# Patient Record
Sex: Male | Born: 1940 | Race: White | Hispanic: No | Marital: Married | Smoking: Never smoker
Health system: Southern US, Community
[De-identification: ages and names within clinical notes are randomized; demographics above are authoritative.]

## PROBLEM LIST (undated history)

## (undated) DIAGNOSIS — C801 Malignant (primary) neoplasm, unspecified: Secondary | ICD-10-CM

## (undated) DIAGNOSIS — I10 Essential (primary) hypertension: Secondary | ICD-10-CM

## (undated) DIAGNOSIS — G3184 Mild cognitive impairment, so stated: Secondary | ICD-10-CM

## (undated) DIAGNOSIS — N401 Enlarged prostate with lower urinary tract symptoms: Secondary | ICD-10-CM

## (undated) DIAGNOSIS — R413 Other amnesia: Secondary | ICD-10-CM

## (undated) DIAGNOSIS — C439 Malignant melanoma of skin, unspecified: Secondary | ICD-10-CM

## (undated) DIAGNOSIS — R339 Retention of urine, unspecified: Secondary | ICD-10-CM

## (undated) DIAGNOSIS — N3943 Post-void dribbling: Secondary | ICD-10-CM

## (undated) DIAGNOSIS — E785 Hyperlipidemia, unspecified: Secondary | ICD-10-CM

## (undated) DIAGNOSIS — E041 Nontoxic single thyroid nodule: Secondary | ICD-10-CM

## (undated) HISTORY — DX: Hyperlipidemia, unspecified: E78.5

## (undated) HISTORY — DX: Malignant melanoma of skin, unspecified: C43.9

## (undated) HISTORY — DX: Retention of urine, unspecified: R33.9

## (undated) HISTORY — DX: Other amnesia: R41.3

## (undated) HISTORY — DX: Essential (primary) hypertension: I10

## (undated) HISTORY — PX: HYDROCELE EXCISION: SHX482

## (undated) HISTORY — DX: Mild cognitive impairment, so stated: G31.84

## (undated) HISTORY — DX: Malignant (primary) neoplasm, unspecified: C80.1

## (undated) HISTORY — DX: Post-void dribbling: N39.43

## (undated) HISTORY — DX: Benign prostatic hyperplasia with lower urinary tract symptoms: N40.1

## (undated) HISTORY — DX: Nontoxic single thyroid nodule: E04.1

---

## 1975-11-20 HISTORY — PX: VASECTOMY: SHX75

## 2005-06-21 ENCOUNTER — Ambulatory Visit: Payer: Self-pay | Admitting: Internal Medicine

## 2008-10-26 ENCOUNTER — Ambulatory Visit: Payer: Self-pay | Admitting: Unknown Physician Specialty

## 2011-08-03 ENCOUNTER — Ambulatory Visit: Payer: Self-pay | Admitting: Internal Medicine

## 2011-08-28 DIAGNOSIS — G3184 Mild cognitive impairment, so stated: Secondary | ICD-10-CM

## 2011-08-28 HISTORY — DX: Mild cognitive impairment of uncertain or unknown etiology: G31.84

## 2013-05-27 DIAGNOSIS — N3943 Post-void dribbling: Secondary | ICD-10-CM

## 2013-05-27 DIAGNOSIS — N401 Enlarged prostate with lower urinary tract symptoms: Secondary | ICD-10-CM

## 2013-05-27 DIAGNOSIS — R339 Retention of urine, unspecified: Secondary | ICD-10-CM

## 2013-05-27 HISTORY — DX: Post-void dribbling: N39.43

## 2013-05-27 HISTORY — DX: Benign prostatic hyperplasia with lower urinary tract symptoms: N40.1

## 2013-05-27 HISTORY — DX: Retention of urine, unspecified: R33.9

## 2014-01-11 ENCOUNTER — Ambulatory Visit: Payer: Self-pay | Admitting: Unknown Physician Specialty

## 2014-05-16 DIAGNOSIS — E785 Hyperlipidemia, unspecified: Secondary | ICD-10-CM | POA: Insufficient documentation

## 2014-05-16 DIAGNOSIS — N138 Other obstructive and reflux uropathy: Secondary | ICD-10-CM | POA: Insufficient documentation

## 2014-05-16 DIAGNOSIS — I10 Essential (primary) hypertension: Secondary | ICD-10-CM | POA: Insufficient documentation

## 2014-05-16 DIAGNOSIS — E041 Nontoxic single thyroid nodule: Secondary | ICD-10-CM

## 2014-05-16 DIAGNOSIS — N401 Enlarged prostate with lower urinary tract symptoms: Secondary | ICD-10-CM | POA: Insufficient documentation

## 2014-05-16 HISTORY — DX: Hyperlipidemia, unspecified: E78.5

## 2014-05-16 HISTORY — DX: Nontoxic single thyroid nodule: E04.1

## 2014-05-16 HISTORY — DX: Essential (primary) hypertension: I10

## 2015-07-22 DIAGNOSIS — Z0000 Encounter for general adult medical examination without abnormal findings: Secondary | ICD-10-CM | POA: Insufficient documentation

## 2015-07-22 DIAGNOSIS — R413 Other amnesia: Secondary | ICD-10-CM | POA: Insufficient documentation

## 2015-07-22 HISTORY — DX: Other amnesia: R41.3

## 2017-10-02 ENCOUNTER — Encounter: Payer: Self-pay | Admitting: Urology

## 2017-10-02 ENCOUNTER — Ambulatory Visit: Payer: Medicare Other | Admitting: Urology

## 2017-10-02 VITALS — BP 134/67 | HR 83 | Ht 71.0 in | Wt 140.0 lb

## 2017-10-02 DIAGNOSIS — R350 Frequency of micturition: Secondary | ICD-10-CM

## 2017-10-02 DIAGNOSIS — N401 Enlarged prostate with lower urinary tract symptoms: Secondary | ICD-10-CM

## 2017-10-02 LAB — BLADDER SCAN AMB NON-IMAGING

## 2017-10-02 MED ORDER — TAMSULOSIN HCL 0.4 MG PO CAPS: 0.4000 mg | ORAL_CAPSULE | Freq: Every day | ORAL | 3 refills | Status: DC

## 2017-10-02 NOTE — Progress Notes (Signed)
10/02/2017 2:10 PM   NANCY MANUELE 18-Nov-1941 [id number]  Referring provider: No referring provider defined for this encounter.  Chief Complaint  Patient presents with  . Urinary Frequency    HPI: Juan Barron is a 76 year old male who presents for transfer of care from [facility]  He has seen Dr. Jacqlyn Larsen for several years for BPH with lower urinary tract symptoms and is on tamsulosin.  He was last seen at [facility] in August 2017.  He has stable lower urinary tract symptoms and has occasional urinary frequency and nocturia x1.  IPSS completed today was 2/35 with a q. OL rated 1/6.  He denies dysuria or gross hematuria.  Denies flank, abdominal, pelvic or scrotal pain.  He has occasional postvoid dribbling.   PMH: Past Medical History:  Diagnosis Date  . 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 HTN (hypertension), benign 05/16/2014   Overview:  years of "white coat HTN", started drug treatment in 07/2011 Last Assessment & Plan:  Taking medications without noted side effects or dizziness.    . Hyperlipidemia, unspecified 05/16/2014   Last Assessment & Plan:  Diet for healthy cholesterol is being attempted and no clear myalgia's or other side effects are noted.    . Incomplete emptying of bladder 05/27/2013  . Memory disturbance 07/22/2015   Overview:  Mild and seemingly not progressive  Last Assessment & Plan:  Memory is poor but not clearly progressive  . 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  MMSE 27/30.  MOCA on 08/25/13 was 20/30  . Post-void dribbling 05/27/2013  . Thyroid nodule 05/16/2014   Overview:  And subclinical hypothyroid  Last Assessment & Plan:  Tsh and energy are fairly stable.     Surgical History: Past Surgical History:  Procedure Laterality Date  . HYDROCELE EXCISION    . VASECTOMY  1977    Home Medications:  Allergies as of 10/02/2017   No Known Allergies       Medication List        Accurate as of 10/02/17  2:10 PM. Always use your most recent med list.          amLODipine 5 MG tablet Commonly known as:  NORVASC   aspirin EC 81 MG tablet Take by mouth.   DOCOSAHEXAENOIC ACID PO Take by mouth.   multivitamin with minerals tablet Take by mouth.   tamsulosin 0.4 MG Caps capsule Commonly known as:  FLOMAX Take 1 capsule (0.4 mg total) daily after breakfast by mouth.       Allergies: No Known Allergies  Family History: Family History  Problem Relation Age of Onset  . Kidney cancer Sister   . Prostate cancer Neg Hx   . Bladder Cancer Neg Hx     Social History:  reports that  has never smoked. he has never used smokeless tobacco. He reports that he does not drink alcohol or use drugs.  ROS: UROLOGY Frequent Urination?: Yes Hard to postpone urination?: Yes Burning/pain with urination?: No Get up at night to urinate?: Yes Leakage of urine?: Yes Urine stream starts and stops?: No Trouble starting stream?: No Do you have to strain to urinate?: No Blood in urine?: No Urinary tract infection?: No Sexually transmitted disease?: No Injury to kidneys or bladder?: No Painful intercourse?: No Weak stream?: No Erection problems?: No Penile pain?: No  Gastrointestinal Nausea?: No Vomiting?: No Indigestion/heartburn?:  No Diarrhea?: No Constipation?: No  Constitutional Fever: No Night sweats?: No Weight loss?: No Fatigue?: No  Skin Skin rash/lesions?: No Itching?: No  Eyes Blurred vision?: No Double vision?: No  Ears/Nose/Throat Sore throat?: No Sinus problems?: No  Hematologic/Lymphatic Swollen glands?: No Easy bruising?: No  Cardiovascular Leg swelling?: No Chest pain?: No  Respiratory Cough?: No Shortness of breath?: No  Endocrine Excessive thirst?: No  Musculoskeletal Back pain?: No Joint pain?: No  Neurological Headaches?: No Dizziness?: No  Psychologic Depression?: No Anxiety?:  No  Physical Exam: BP 134/67   Pulse 83   Ht 5\' 11"  (1.803 m)   Wt 140 lb (63.5 kg)   BMI 19.53 kg/m   Constitutional:  Alert and oriented, No acute distress. HEENT: [place] AT, moist mucus membranes.  Trachea midline, no masses. Cardiovascular: No clubbing, cyanosis, or edema. Respiratory: Normal respiratory effort, no increased work of breathing. GI: Abdomen is soft, nontender, nondistended, no abdominal masses GU: No CVA tenderness.  Prostate flat, smooth, 35 g Skin: No rashes, bruises or suspicious lesions. Lymph: No cervical or inguinal adenopathy. Neurologic: Grossly intact, no focal deficits, moving all 4 extremities. Psychiatric: Normal mood and affect.  Laboratory Data:  Urinalysis    Assessment & Plan:    1.  BPH with lower urinary tract symptoms Stable voiding symptoms on tamsulosin which was refilled.  PVR by bladder scan was 70 mL.  Follow-up annually or as needed for any significant change in his symptoms.  - Urinalysis, Complete - BLADDER SCAN AMB NON-IMAGING   Return in about 1 year (around 10/02/2018) for Recheck.  Abbie Sons, [place] [address], [place], [place] [address] [phone]

## 2017-10-03 LAB — URINALYSIS, COMPLETE
Bilirubin, UA: NEGATIVE
Glucose, UA: NEGATIVE
Ketones, UA: NEGATIVE
Leukocytes, UA: NEGATIVE
NITRITE UA: NEGATIVE
Protein, UA: NEGATIVE
RBC, UA: NEGATIVE
Specific Gravity, UA: 1.02 (ref 1.005–1.030)
Urobilinogen, Ur: 0.2 mg/dL (ref 0.2–1.0)
pH, UA: 7 (ref 5.0–7.5)

## 2017-10-03 LAB — MICROSCOPIC EXAMINATION
EPITHELIAL CELLS (NON RENAL): NONE SEEN /HPF (ref 0–10)
RBC MICROSCOPIC, UA: NONE SEEN /HPF (ref 0–?)
WBC, UA: NONE SEEN /hpf (ref 0–?)

## 2018-10-01 ENCOUNTER — Ambulatory Visit (INDEPENDENT_AMBULATORY_CARE_PROVIDER_SITE_OTHER): Payer: Medicare Other | Admitting: Urology

## 2018-10-01 ENCOUNTER — Encounter: Payer: Self-pay | Admitting: Urology

## 2018-10-01 VITALS — BP 133/61 | HR 66 | Ht 71.0 in | Wt 138.5 lb

## 2018-10-01 DIAGNOSIS — R350 Frequency of micturition: Secondary | ICD-10-CM

## 2018-10-01 DIAGNOSIS — N401 Enlarged prostate with lower urinary tract symptoms: Secondary | ICD-10-CM | POA: Diagnosis not present

## 2018-10-01 LAB — BLADDER SCAN AMB NON-IMAGING

## 2018-10-01 MED ORDER — TAMSULOSIN HCL 0.4 MG PO CAPS: 0.4000 mg | ORAL_CAPSULE | Freq: Every day | ORAL | 3 refills | Status: DC

## 2018-10-01 NOTE — Progress Notes (Signed)
10/01/2018 9:32 AM   Juan Barron 07-28-1941 [id number]  Referring provider: Kirk Ruths, MD [facility] [place], [place] [address]  Chief Complaint  Patient presents with  . Benign Prostatic Hypertrophy   Urologic history: 1.  BPH with lower urinary tract symptoms -On tamsulosin  HPI: 77 year old male presents for annual follow-up.  He remains on tamsulosin and has no bothersome lower urinary tract symptoms.  He gets up once per night to void.  IPSS completed today was 1/35 with a QOL rated 1/6.  He denies dysuria or gross hematuria.  Denies flank/abdominal/pelvic or scrotal pain.  He had a PSA with his PCP in September 2019 which was 0.39.   PMH: Past Medical History:  Diagnosis Date  . 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 HTN (hypertension), benign 05/16/2014   Overview:  years of "white coat HTN", started drug treatment in 07/2011 Last Assessment & Plan:  Taking medications without noted side effects or dizziness.    . Hyperlipidemia, unspecified 05/16/2014   Last Assessment & Plan:  Diet for healthy cholesterol is being attempted and no clear myalgia's or other side effects are noted.    . Incomplete emptying of bladder 05/27/2013  . Memory disturbance 07/22/2015   Overview:  Mild and seemingly not progressive  Last Assessment & Plan:  Memory is poor but not clearly progressive  . 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  MMSE 27/30.  MOCA on 08/25/13 was 20/30  . Post-void dribbling 05/27/2013  . Thyroid nodule 05/16/2014   Overview:  And subclinical hypothyroid  Last Assessment & Plan:  Tsh and energy are fairly stable.     Surgical History: Past Surgical History:  Procedure Laterality Date  . HYDROCELE EXCISION    . VASECTOMY  1977    Home Medications:  Allergies as of 10/01/2018   No Known Allergies       Medication List        Accurate as of 10/01/18  9:32 AM. Always use your most recent med list.          amLODipine 5 MG tablet Commonly known as:  NORVASC   aspirin EC 81 MG tablet Take by mouth.   multivitamin with minerals tablet Take by mouth.   tamsulosin 0.4 MG Caps capsule Commonly known as:  FLOMAX Take 1 capsule (0.4 mg total) daily after breakfast by mouth.       Allergies: No Known Allergies  Family History: Family History  Problem Relation Age of Onset  . Kidney cancer Sister   . Prostate cancer Neg Hx   . Bladder Cancer Neg Hx     Social History:  reports that he has never smoked. He has never used smokeless tobacco. He reports that he does not drink alcohol or use drugs.  ROS: Please refer to today's intake sheet for a complete review of systems  Physical Exam: BP 133/61 (BP Location: Left Arm, Patient Position: Sitting, Cuff Size: Normal)   Pulse 66   Ht 5\' 11"  (1.803 m)   Wt 138 lb 8 oz (62.8 kg)   BMI 19.32 kg/m   Constitutional:  Alert and oriented, No acute distress. HEENT: [place] AT, moist mucus membranes.  Trachea midline, no masses. Cardiovascular: No clubbing, cyanosis, or edema. Respiratory: Normal respiratory effort, no increased work of breathing. Lymph: No cervical or  inguinal lymphadenopathy. Skin: No rashes, bruises or suspicious lesions. Neurologic: Grossly intact, no focal deficits, moving all 4 extremities. Psychiatric: Normal mood and affect.   Assessment & Plan:   77 year old male with stable lower urinary tract symptoms on tamsulosin.  PVR by bladder scan today was 0 mL.  Tamsulosin was refilled.  Continue annual follow-up.  - Bladder Scan (Post Void Residual) in office    Abbie Sons, MD  [place] [address], [place], [place] [address] [phone]

## 2018-12-26 DIAGNOSIS — Z86010 Personal history of colonic polyps: Secondary | ICD-10-CM | POA: Insufficient documentation

## 2019-01-11 ENCOUNTER — Emergency Department
Admission: EM | Admit: 2019-01-11 | Discharge: 2019-01-11 | Disposition: A | Discharge status: Home / Self Care | Payer: Medicare Other | Attending: Emergency Medicine | Admitting: Emergency Medicine

## 2019-01-11 ENCOUNTER — Other Ambulatory Visit: Payer: Self-pay

## 2019-01-11 DIAGNOSIS — I10 Essential (primary) hypertension: Secondary | ICD-10-CM | POA: Diagnosis not present

## 2019-01-11 DIAGNOSIS — R55 Syncope and collapse: Secondary | ICD-10-CM | POA: Insufficient documentation

## 2019-01-11 DIAGNOSIS — E785 Hyperlipidemia, unspecified: Secondary | ICD-10-CM | POA: Diagnosis not present

## 2019-01-11 DIAGNOSIS — Z79899 Other long term (current) drug therapy: Secondary | ICD-10-CM | POA: Diagnosis not present

## 2019-01-11 LAB — COMPREHENSIVE METABOLIC PANEL
ALBUMIN: 3.7 g/dL (ref 3.5–5.0)
ALK PHOS: 62 U/L (ref 38–126)
ALT: 13 U/L (ref 0–44)
AST: 17 U/L (ref 15–41)
Anion gap: 3 — ABNORMAL LOW (ref 5–15)
BILIRUBIN TOTAL: 0.7 mg/dL (ref 0.3–1.2)
BUN: 22 mg/dL (ref 8–23)
CALCIUM: 7.9 mg/dL — AB (ref 8.9–10.3)
CO2: 29 mmol/L (ref 22–32)
CREATININE: 1.07 mg/dL (ref 0.61–1.24)
Chloride: 107 mmol/L (ref 98–111)
GFR calc Af Amer: >60 mL/min (ref >60)
GLUCOSE: 108 mg/dL — AB (ref 70–99)
Potassium: 3.5 mmol/L (ref 3.5–5.1)
SODIUM: 139 mmol/L (ref 135–145)
TOTAL PROTEIN: 6.1 g/dL — AB (ref 6.5–8.1)

## 2019-01-11 LAB — CBC
HCT: 39.1 % (ref 39.0–52.0)
Hemoglobin: 13.2 g/dL (ref 13.0–17.0)
MCH: 31.4 pg (ref 26.0–34.0)
MCHC: 33.8 g/dL (ref 30.0–36.0)
MCV: 92.9 fL (ref 80.0–100.0)
NRBC: 0 % (ref 0.0–0.2)
PLATELETS: 197 10*3/uL (ref 150–400)
RBC: 4.21 MIL/uL — AB (ref 4.22–5.81)
RDW: 12.6 % (ref 11.5–15.5)
WBC: 6.7 10*3/uL (ref 4.0–10.5)

## 2019-01-11 LAB — CBC WITH DIFFERENTIAL/PLATELET
Abs Immature Granulocytes: 0.06 10*3/uL (ref 0.00–0.07)
BASOS PCT: 1 %
Basophils Absolute: 0.1 10*3/uL (ref 0.0–0.1)
Eosinophils Absolute: 0.1 10*3/uL (ref 0.0–0.5)
Eosinophils Relative: 1 %
HCT: 40.8 % (ref 39.0–52.0)
Hemoglobin: 13.7 g/dL (ref 13.0–17.0)
Immature Granulocytes: 1 %
LYMPHS ABS: 1.1 10*3/uL (ref 0.7–4.0)
Lymphocytes Relative: 11 %
MCH: 30.9 pg (ref 26.0–34.0)
MCHC: 33.6 g/dL (ref 30.0–36.0)
MCV: 91.9 fL (ref 80.0–100.0)
MONOS PCT: 4 %
Monocytes Absolute: 0.4 10*3/uL (ref 0.1–1.0)
Neutro Abs: 8.4 10*3/uL — ABNORMAL HIGH (ref 1.7–7.7)
Neutrophils Relative %: 82 %
Platelets: 212 10*3/uL (ref 150–400)
RBC: 4.44 MIL/uL (ref 4.22–5.81)
RDW: 12.5 % (ref 11.5–15.5)
WBC: 10.1 10*3/uL (ref 4.0–10.5)
nRBC: 0 % (ref 0.0–0.2)

## 2019-01-11 LAB — TROPONIN I

## 2019-01-11 MED ORDER — SODIUM CHLORIDE 0.9 % IV BOLUS
1000.0000 mL | Freq: Once | INTRAVENOUS | Status: AC
  Administered 2019-01-11: 1000 mL via INTRAVENOUS

## 2019-01-11 NOTE — ED Notes (Signed)
Pt up to use urinal at bedside at this time

## 2019-01-11 NOTE — ED Notes (Signed)
zole pads placed on pt to monitor at this time

## 2019-01-11 NOTE — ED Notes (Signed)
Report from lorrie, rn.  

## 2019-01-11 NOTE — ED Triage Notes (Addendum)
Pt arrives via ems from [employer]. Ems states that he was at [facility] in bleechers, sitting, when lost consciousness. On first paramedic arrival to scene medic could not palpate, or auscultate peripheral pulse or pressure, pt appeared grey in color at that time. Pt had a carotid pulse at that time and was conscious. Pt then experienced a second syncopal episode. Wife states pt was unconscious and not responding for several minutes. On arrival to ed pt alert, NAD noted at this time, with good skin color but wife states he has some short term memory loss at times. Functions normally but when upset increases confusion. Pt received 600 mL NS prior to arrival.

## 2019-01-11 NOTE — ED Provider Notes (Signed)
[facility] Emergency Department Provider Note  ____________________________________________  Time seen: Approximately 3:18 PM  I have reviewed the triage vital signs and the nursing notes.   HISTORY  Chief Complaint Loss of Consciousness    HPI Juan Barron is a 77 y.o. male with a history of hypertension and prostatic hypertrophy and chronic "memory issues" who was in his usual state of health  and attending a high school volleyball game today when he suddenly felt warm and lightheaded and then passed out.  He reports feeling "great" at the beginning of the volleyball game when he entered the gym.  He ate normally and has been drinking fluids including lunch at a restaurant with tea.  Denies any prodromal symptoms such as chest pain abdominal pain back pain headache vision change paresthesias weakness or shortness of breath.  No symptoms afterward.  First medic on scene noted absent peripheral pulse but intact carotid pulse and grayish skin color.  Apparently as the patient started to feel better they had the patient sit upright which caused him to pass out a second time.  EMS gave 600 mL's of normal saline prior to arrival and patient reports he currently feels totally fine and has no symptoms.  Denies any prior episodes of orthostatic symptoms such as dizziness or passing out at home.  No exertional symptoms.  Normally walks 4 miles a day.     Past Medical History:  Diagnosis Date  . 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 HTN (hypertension), benign 05/16/2014   Overview:  years of "white coat HTN", started drug treatment in 07/2011 Last Assessment & Plan:  Taking medications without noted side effects or dizziness.    . Hyperlipidemia, unspecified 05/16/2014   Last Assessment & Plan:  Diet for healthy cholesterol is being attempted and no clear myalgia's or other side effects are  noted.    . Incomplete emptying of bladder 05/27/2013  . Memory disturbance 07/22/2015   Overview:  Mild and seemingly not progressive  Last Assessment & Plan:  Memory is poor but not clearly progressive  . 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  MMSE 27/30.  MOCA on 08/25/13 was 20/30  . Post-void dribbling 05/27/2013  . Thyroid nodule 05/16/2014   Overview:  And subclinical hypothyroid  Last Assessment & Plan:  Tsh and energy are fairly stable.      Patient Active Problem List   Diagnosis Date Noted  . Health care maintenance 07/22/2015  . Memory disturbance 07/22/2015  . HTN (hypertension), benign 05/16/2014  . Hyperlipidemia, unspecified 05/16/2014  . Thyroid nodule 05/16/2014  . BPH with obstruction/lower urinary tract symptoms 05/16/2014  . Benign prostatic hyperplasia with urinary frequency 05/27/2013  . Incomplete emptying of bladder 05/27/2013  . Post-void dribbling 05/27/2013  . Mild cognitive impairment with memory loss 08/28/2011     Past Surgical History:  Procedure Laterality Date  . HYDROCELE EXCISION    . VASECTOMY  1977     Prior to Admission medications   Medication Sig Start Date End Date Taking? Authorizing Provider  amLODipine (NORVASC) 5 MG tablet  07/02/17   [provider]  aspirin EC 81 MG tablet Take by mouth.    [provider]  Multiple Vitamins-Minerals (MULTIVITAMIN WITH MINERALS) tablet Take by mouth.    [provider]  tamsulosin (FLOMAX) 0.4 MG CAPS capsule Take 1 capsule (  0.4 mg total) by mouth daily after breakfast. 10/01/18   Stoioff, Ronda Fairly, MD     Allergies Patient has no known allergies.   Family History  Problem Relation Age of Onset  . Kidney cancer Sister   . Prostate cancer Neg Hx   . Bladder Cancer Neg Hx     Social History Social History   Tobacco Use  . Smoking status: Never Smoker  . Smokeless tobacco: Never Used  Substance Use Topics  . Alcohol use:  No    Frequency: Never  . Drug use: No    Review of Systems  Constitutional:   No fever or chills.  ENT:   No sore throat. No rhinorrhea. Cardiovascular:   No chest pain or syncope. Respiratory:   No dyspnea or cough. Gastrointestinal:   Negative for abdominal pain, vomiting and diarrhea.  Musculoskeletal:   Negative for focal pain or swelling All other systems reviewed and are negative except as documented above in ROS and HPI.  ____________________________________________   PHYSICAL EXAM:  VITAL SIGNS: ED Triage Vitals  Enc Vitals Group     BP 01/11/19 1506 140/67     Pulse Rate 01/11/19 1506 61     Resp 01/11/19 1506 20     Temp 01/11/19 1506 98.2 F (36.8 C)     Temp Source 01/11/19 1506 Oral     SpO2 01/11/19 1506 98 %     Weight 01/11/19 1507 147 lb (66.7 kg)     Height 01/11/19 1507 5\' 11"  (1.803 m)     Head Circumference --      Peak Flow --      Pain Score 01/11/19 1507 0     Pain Loc --      Pain Edu? --      Excl. in [place]? --     Vital signs reviewed, nursing assessments reviewed.   Constitutional:   Alert and oriented. Non-toxic appearance. Eyes:   Conjunctivae are normal. EOMI. PERRL.  No proptosis ENT      Head:   Normocephalic and atraumatic.      Nose:   No congestion/rhinnorhea.       Mouth/Throat:   MMM, no pharyngeal erythema. No peritonsillar mass.       Neck:   No meningismus. Full ROM.  No goiter Hematological/Lymphatic/Immunilogical:   No cervical lymphadenopathy. Cardiovascular:   RRR. Symmetric bilateral radial and DP pulses.  Cap refill less than 2 seconds.  No concerning murmur Respiratory:   Normal respiratory effort without tachypnea/retractions. Breath sounds are clear and equal bilaterally. No wheezes/rales/rhonchi. Gastrointestinal:   Soft and nontender. Non distended. There is no CVA tenderness.  No rebound, rigidity, or guarding. Musculoskeletal:   Normal range of motion in all extremities. No joint effusions.  No lower extremity  tenderness.  No edema. Neurologic:   Normal speech and language.  Motor grossly intact. No acute focal neurologic deficits are appreciated.  Skin:    Skin is warm, dry and intact. No rash noted.  No petechiae, purpura, or bullae.  ____________________________________________    LABS (pertinent positives/negatives) (all labs ordered are listed, but only abnormal results are displayed) Labs Reviewed  CBC - Abnormal; Notable for the following components:      Result Value   RBC 4.21 (*)    All other components within normal limits  COMPREHENSIVE METABOLIC PANEL - Abnormal; Notable for the following components:   Glucose, Bld 108 (*)    Calcium 7.9 (*)    Total Protein 6.1 (*)  Anion gap 3 (*)    All other components within normal limits  CBC WITH DIFFERENTIAL/PLATELET - Abnormal; Notable for the following components:   Neutro Abs 8.4 (*)    All other components within normal limits  TROPONIN I  TROPONIN I   ____________________________________________   EKG  Interpreted by me Sinus rhythm rate of 60, left axis, normal intervals.  Normal QRS ST segment and T waves.  No acute ischemic changes   ____________________________________________    RADIOLOGY  No results found.  ____________________________________________   PROCEDURES Procedures  ____________________________________________  DIFFERENTIAL DIAGNOSIS   Dehydration, heat syncope, vagal episode, silent MI.  Doubt stroke, intracranial hemorrhage, cerebral aneurysm, aortic dissection, AAA, mesenteric ischemia pulmonary embolism pneumonia UTI SSTI or sepsis.  CLINICAL IMPRESSION / ASSESSMENT AND PLAN / ED COURSE  Medications ordered in the ED: Medications  sodium chloride 0.9 % bolus 1,000 mL (0 mLs Intravenous Stopped 01/11/19 1618)    Pertinent labs & imaging results that were available during my care of the patient were reviewed by me and considered in my medical decision making (see chart for  details).    Patient presents with syncope which appears to have an orthostatic component.  No prodromal symptoms, no significant symptoms afterward.  Feeling better after receiving IV fluids by EMS.  Due to age, hypertension, memory issues, I will screen for acute MI with serial troponins while checking electrolytes and giving IV fluids for hydration.  There does not appear to be any acute cardiopulmonary, vascular, or neurologic event based on the lack of symptoms and normal exam.  On telemetry monitor patient has occasional PVCs  ----------------------------------------- 8:16 PM on 01/11/2019 -----------------------------------------  Remains asymptomatic.  Second troponin and CBC stable and normal.  Vital signs remain unremarkable.  Recommend close follow-up with primary care.  Continue home medications. Will check orthostatics prior to DC.       ____________________________________________   FINAL CLINICAL IMPRESSION(S) / ED DIAGNOSES    Final diagnoses:  Syncope, unspecified syncope type     ED Discharge Orders    None      Portions of this note were generated with dragon dictation software. Dictation errors may occur despite best attempts at proofreading.   Carrie Mew, MD 01/11/19 2017

## 2019-01-11 NOTE — ED Notes (Signed)
Pt and family updated on care plan. Pt denies needs.

## 2019-01-11 NOTE — ED Notes (Signed)
Pt stood at bedside and urinated into urinal. No distress noted.

## 2019-03-04 ENCOUNTER — Encounter: Admission: RE | Payer: Self-pay | Source: Home / Self Care

## 2019-03-04 ENCOUNTER — Ambulatory Visit
Admission: RE | Admit: 2019-03-04 | Payer: Medicare Other | Source: Home / Self Care | Admitting: Unknown Physician Specialty

## 2019-03-04 SURGERY — COLONOSCOPY WITH PROPOFOL
Anesthesia: General

## 2019-10-02 ENCOUNTER — Other Ambulatory Visit: Payer: Self-pay

## 2019-10-02 ENCOUNTER — Ambulatory Visit: Payer: Medicare Other | Admitting: Urology

## 2019-10-02 ENCOUNTER — Encounter: Payer: Self-pay | Admitting: Urology

## 2019-10-02 VITALS — BP 126/63 | HR 65 | Ht 71.0 in | Wt 138.0 lb

## 2019-10-02 DIAGNOSIS — N401 Enlarged prostate with lower urinary tract symptoms: Secondary | ICD-10-CM

## 2019-10-02 DIAGNOSIS — R350 Frequency of micturition: Secondary | ICD-10-CM

## 2019-10-02 MED ORDER — TAMSULOSIN HCL 0.4 MG PO CAPS: 0.4000 mg | ORAL_CAPSULE | Freq: Every day | ORAL | 3 refills | Status: DC

## 2019-10-02 NOTE — Progress Notes (Signed)
10/02/2019 12:40 PM   Juan Barron [birth year] [id number]  Referring provider: Kirk Ruths, MD [facility] [place],  [place] [address]  Chief Complaint  Patient presents with  . Benign Prostatic Hypertrophy    Urologic history: 1.  BPH with lower urinary tract symptoms -On tamsulosin   HPI: 78 y.o. male presents for annual follow-up of BPH.  He remains on tamsulosin and states he is doing well.  He has nocturia x0-1.  Denies bothersome lower urinary tract symptoms.  Denies dysuria, gross hematuria or flank, abdominal, pelvic pain.   PMH: Past Medical History:  Diagnosis Date  . 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 HTN (hypertension), benign 05/16/2014   Overview:  years of "white coat HTN", started drug treatment in 07/2011 Last Assessment & Plan:  Taking medications without noted side effects or dizziness.    . Hyperlipidemia, unspecified 05/16/2014   Last Assessment & Plan:  Diet for healthy cholesterol is being attempted and no clear myalgia's or other side effects are noted.    . Incomplete emptying of bladder 05/27/2013  . Memory disturbance 07/22/2015   Overview:  Mild and seemingly not progressive  Last Assessment & Plan:  Memory is poor but not clearly progressive  . 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  MMSE 27/30.  MOCA on 08/25/13 was 20/30  . Post-void dribbling 05/27/2013  . Thyroid nodule 05/16/2014   Overview:  And subclinical hypothyroid  Last Assessment & Plan:  Tsh and energy are fairly stable.     Surgical History: Past Surgical History:  Procedure Laterality Date  . HYDROCELE EXCISION    . VASECTOMY  1977    Home Medications:  Allergies as of 10/02/2019   No Known Allergies     Medication List       Accurate as of October 02, 2019 12:40 PM. If you have any questions, ask your nurse or  doctor.        amLODipine 5 MG tablet Commonly known as: NORVASC   aspirin EC 81 MG tablet Take by mouth.   multivitamin with minerals tablet Take by mouth.   tamsulosin 0.4 MG Caps capsule Commonly known as: FLOMAX Take 1 capsule (0.4 mg total) by mouth daily after breakfast.       Allergies: No Known Allergies  Family History: Family History  Problem Relation Age of Onset  . Kidney cancer Sister   . Prostate cancer Neg Hx   . Bladder Cancer Neg Hx     Social History:  reports that he has never smoked. He has never used smokeless tobacco. He reports that he does not drink alcohol or use drugs.  ROS: UROLOGY Frequent Urination?: No Hard to postpone urination?: No Burning/pain with urination?: Yes Get up at night to urinate?: Yes Leakage of urine?: No Urine stream starts and stops?: No Trouble starting stream?: No Do you have to strain to urinate?: No Blood in urine?: No Urinary tract infection?: No Sexually transmitted disease?: No Injury to kidneys or bladder?: No Painful intercourse?: No Weak stream?: No Erection problems?: No Penile pain?: No  Gastrointestinal Nausea?: No Vomiting?: No Indigestion/heartburn?: No Diarrhea?: No Constipation?: No  Constitutional Fever: No Night sweats?: No Weight loss?: No Fatigue?: No  Skin Skin rash/lesions?: No Itching?: No  Eyes Blurred vision?: No Double vision?: No  Ears/Nose/Throat Sore throat?:  No Sinus problems?: No  Hematologic/Lymphatic Swollen glands?: No Easy bruising?: No  Cardiovascular Leg swelling?: No Chest pain?: No  Respiratory Cough?: No Shortness of breath?: No  Endocrine Excessive thirst?: No  Musculoskeletal Back pain?: No Joint pain?: No  Neurological Headaches?: No Dizziness?: No  Psychologic Depression?: No Anxiety?: No  Physical Exam: BP 126/63   Pulse 65   Ht 5\' 11"  (1.803 m)   Wt 138 lb (62.6 kg)   BMI 19.25 kg/m   Constitutional:  Alert and  oriented, No acute distress. HEENT: [place] AT, moist mucus membranes.  Trachea midline, no masses. Cardiovascular: No clubbing, cyanosis, or edema. Respiratory: Normal respiratory effort, no increased work of breathing. Skin: No rashes, bruises or suspicious lesions. Neurologic: Grossly intact, no focal deficits, moving all 4 extremities. Psychiatric: Normal mood and affect.   Assessment & Plan:    - BPH with lower urinary tract symptoms Stable voiding symptoms on tamsulosin which was refilled.  Return in about 1 year (around 10/01/2020) for Recheck.   Abbie Sons, [place] [address], [place], [place] [address] [phone]

## 2020-03-29 ENCOUNTER — Other Ambulatory Visit: Payer: Self-pay

## 2020-03-30 ENCOUNTER — Telehealth (INDEPENDENT_AMBULATORY_CARE_PROVIDER_SITE_OTHER): Payer: Self-pay | Admitting: Gastroenterology

## 2020-03-30 DIAGNOSIS — Z86010 Personal history of colonic polyps: Secondary | ICD-10-CM

## 2020-03-30 NOTE — Progress Notes (Signed)
Gastroenterology Pre-Procedure Review  Request Date: Tuesday 05/03/20 Requesting Physician: Dr. Allen Norris  PATIENT REVIEW QUESTIONS: The patient responded to the following health history questions as indicated:    1. Are you having any GI issues? no 2. Do you have a personal history of Polyps? yes (repeated every 5 year with Dr. Vira Agar due to history of polyps missed last year) 3. Do you have a family history of Colon Cancer or Polyps? no 4. Diabetes Mellitus? no 5. Joint replacements in the past 12 months?no 6. Major health problems in the past 3 months?no 7. Any artificial heart valves, MVP, or defibrillator?no    MEDICATIONS & ALLERGIES:    Patient reports the following regarding taking any anticoagulation/antiplatelet therapy:   Plavix, Coumadin, Eliquis, Xarelto, Lovenox, Pradaxa, Brilinta, or Effient? yes (81 mg daily) Aspirin? yes (81 mg daily)  Patient confirms/reports the following medications:  Current Outpatient Medications  Medication Sig Dispense Refill  . amLODipine (NORVASC) 5 MG tablet   3  . aspirin EC 81 MG tablet Take by mouth.    Marland Kitchen atropine 1 % ophthalmic solution Apply to eye.    . brimonidine (ALPHAGAN) 0.2 % ophthalmic solution     . CYCLOGYL 2 % ophthalmic solution Place 1 drop into the left eye 3 (three) times daily.    Marland Kitchen ketorolac (ACULAR) 0.5 % ophthalmic solution INSTILL 1 DROP IN LEFT EYE FOUR TIMES DAILY. START 72 HOURS BEFORE SURGERY    . Multiple Vitamins-Minerals (MULTIVITAMIN WITH MINERALS) tablet Take by mouth.    Marland Kitchen ofloxacin (OCUFLOX) 0.3 % ophthalmic solution INSTILL 1 DROP IN BOTH EYES FOUR TIMES DAILY. START 72 HOURS BEFORE SURGERY    . prednisoLONE acetate (PRED FORTE) 1 % ophthalmic suspension INSTILL 1 DROP INTO LEFT EYE FOUR TIMES DAILY. USE ONLY AFTER SURGERY    . tamsulosin (FLOMAX) 0.4 MG CAPS capsule Take 1 capsule (0.4 mg total) by mouth daily after breakfast. 90 capsule 3   No current facility-administered medications for this visit.     Patient confirms/reports the following allergies:  No Known Allergies  Orders Placed This Encounter  Procedures  . Procedural/ Surgical Case Request: COLONOSCOPY WITH PROPOFOL    Standing Status:   Standing    Number of Occurrences:   1    Order Specific Question:   Pre-op diagnosis    Answer:   personal history of colon polyps    Order Specific Question:   CPT Code    Answer:   CS:[id number]    AUTHORIZATION INFORMATION Primary Insurance: 1D#: Group #:  Secondary Insurance: 1D#: Group #:  SCHEDULE INFORMATION: Date:  Time: Location:

## 2020-04-14 ENCOUNTER — Ambulatory Visit: Payer: Medicare PPO | Admitting: Gastroenterology

## 2020-04-29 ENCOUNTER — Other Ambulatory Visit: Payer: Self-pay

## 2020-04-29 ENCOUNTER — Other Ambulatory Visit
Admission: RE | Admit: 2020-04-29 | Discharge: 2020-04-29 | Disposition: A | Discharge status: Home / Self Care | Payer: Medicare PPO | Source: Ambulatory Visit | Attending: Gastroenterology | Admitting: Gastroenterology

## 2020-04-29 DIAGNOSIS — Z20822 Contact with and (suspected) exposure to covid-19: Secondary | ICD-10-CM | POA: Diagnosis not present

## 2020-04-29 DIAGNOSIS — Z01812 Encounter for preprocedural laboratory examination: Secondary | ICD-10-CM | POA: Insufficient documentation

## 2020-04-29 LAB — SARS CORONAVIRUS 2 (TAT 6-24 HRS): SARS Coronavirus 2: NEGATIVE

## 2020-05-03 ENCOUNTER — Encounter: Payer: Self-pay | Admitting: Gastroenterology

## 2020-05-03 ENCOUNTER — Ambulatory Visit: Payer: Medicare PPO | Admitting: Anesthesiology

## 2020-05-03 ENCOUNTER — Encounter
Admission: RE | Disposition: A | Discharge status: Home / Self Care | Payer: Self-pay | Source: Home / Self Care | Attending: Gastroenterology

## 2020-05-03 ENCOUNTER — Other Ambulatory Visit: Payer: Self-pay

## 2020-05-03 ENCOUNTER — Ambulatory Visit
Admission: RE | Admit: 2020-05-03 | Discharge: 2020-05-03 | Disposition: A | Discharge status: Home / Self Care | Payer: Medicare PPO | Attending: Gastroenterology | Admitting: Gastroenterology

## 2020-05-03 DIAGNOSIS — K635 Polyp of colon: Secondary | ICD-10-CM

## 2020-05-03 DIAGNOSIS — Z860100 Personal history of colon polyps, unspecified: Secondary | ICD-10-CM

## 2020-05-03 DIAGNOSIS — Z79899 Other long term (current) drug therapy: Secondary | ICD-10-CM | POA: Insufficient documentation

## 2020-05-03 DIAGNOSIS — K640 First degree hemorrhoids: Secondary | ICD-10-CM | POA: Diagnosis not present

## 2020-05-03 DIAGNOSIS — Z7982 Long term (current) use of aspirin: Secondary | ICD-10-CM | POA: Diagnosis not present

## 2020-05-03 DIAGNOSIS — D124 Benign neoplasm of descending colon: Secondary | ICD-10-CM | POA: Diagnosis not present

## 2020-05-03 DIAGNOSIS — I10 Essential (primary) hypertension: Secondary | ICD-10-CM | POA: Diagnosis not present

## 2020-05-03 DIAGNOSIS — Z1211 Encounter for screening for malignant neoplasm of colon: Secondary | ICD-10-CM | POA: Insufficient documentation

## 2020-05-03 DIAGNOSIS — K5730 Diverticulosis of large intestine without perforation or abscess without bleeding: Secondary | ICD-10-CM | POA: Diagnosis not present

## 2020-05-03 HISTORY — PX: COLONOSCOPY WITH PROPOFOL: SHX5780

## 2020-05-03 SURGERY — COLONOSCOPY WITH PROPOFOL
Anesthesia: General

## 2020-05-03 MED ORDER — PROPOFOL 500 MG/50ML IV EMUL
INTRAVENOUS | Status: DC | PRN
  Administered 2020-05-03: 145 ug/kg/min via INTRAVENOUS

## 2020-05-03 MED ORDER — LIDOCAINE HCL (CARDIAC) PF 100 MG/5ML IV SOSY
PREFILLED_SYRINGE | INTRAVENOUS | Status: DC | PRN
  Administered 2020-05-03: 100 mg via INTRAVENOUS

## 2020-05-03 MED ORDER — SODIUM CHLORIDE 0.9 % IV SOLN: INTRAVENOUS | Status: DC

## 2020-05-03 MED ORDER — PROPOFOL 10 MG/ML IV BOLUS
INTRAVENOUS | Status: DC | PRN
  Administered 2020-05-03: 5 mg via INTRAVENOUS
  Administered 2020-05-03: 40 mg via INTRAVENOUS

## 2020-05-03 MED ORDER — SODIUM CHLORIDE FLUSH 0.9 % IV SOLN
INTRAVENOUS | Status: AC
  Filled 2020-05-03: qty 10

## 2020-05-03 NOTE — Op Note (Signed)
[facility] Gastroenterology Patient Name: Juan Barron Procedure Date: 05/03/2020 8:50 AM MRN: [id number] Account #: [id number] Date of Birth: 1940-12-01 Admit Type: Outpatient Age: 79 Room: [facility] ENDO ROOM 4 Gender: Male Note Status: Finalized Procedure:             Colonoscopy Indications:           High risk colon cancer surveillance: Personal history                         of colonic polyps Providers:             [name] MD, MD Referring MD:          Ocie Cornfield. Ouida Sills MD, MD (Referring MD) Medicines:             Propofol per Anesthesia Complications:         No immediate complications. Procedure:             Pre-Anesthesia Assessment:                        - Prior to the procedure, a History and Physical was                         performed, and patient medications and allergies were                         reviewed. The patient's tolerance of previous                         anesthesia was also reviewed. The risks and benefits                         of the procedure and the sedation options and risks                         were discussed with the patient. All questions were                         answered, and informed consent was obtained. Prior                         Anticoagulants: The patient has taken no previous                         anticoagulant or antiplatelet agents. ASA Grade                         Assessment: II - A patient with mild systemic disease.                         After reviewing the risks and benefits, the patient                         was deemed in satisfactory condition to undergo the                         procedure.  After obtaining informed consent, the colonoscope was                         passed under direct vision. Throughout the procedure,                         the patient's blood pressure, pulse, and oxygen                         saturations were monitored continuously. The                          Colonoscope was introduced through the anus and                         advanced to the the cecum, identified by appendiceal                         orifice and ileocecal valve. The colonoscopy was                         performed without difficulty. The patient tolerated                         the procedure well. The quality of the bowel                         preparation was excellent. Findings:      The perianal and digital rectal examinations were normal.      A 5 mm polyp was found in the descending colon. The polyp was sessile.       The polyp was removed with a cold snare. Resection and retrieval were       complete.      Multiple small-mouthed diverticula were found in the sigmoid colon.      Non-bleeding internal hemorrhoids were found during retroflexion. The       hemorrhoids were Grade I (internal hemorrhoids that do not prolapse). Impression:            - One 5 mm polyp in the descending colon, removed with                         a cold snare. Resected and retrieved.                        - Diverticulosis in the sigmoid colon.                        - Non-bleeding internal hemorrhoids. Recommendation:        - Discharge patient to home.                        - Resume previous diet.                        - Continue present medications.                        - Await pathology results. Procedure Code(s):     --- Professional ---  45385, Colonoscopy, flexible; with removal of                         tumor(s), polyp(s), or other lesion(s) by snare                         technique Diagnosis Code(s):     --- Professional ---                        Z86.010, Personal history of colonic polyps                        K63.5, Polyp of colon CPT copyright 2019 American Medical Association. All rights reserved. The codes documented in this report are preliminary and upon coder review may  be revised to meet current compliance  requirements. [name] MD, MD 05/03/2020 9:08:41 AM This report has been signed electronically. Number of Addenda: 0 Note Initiated On: 05/03/2020 8:50 AM Scope Withdrawal Time: 0 hours 6 minutes 35 seconds  Total Procedure Duration: 0 hours 11 minutes 58 seconds  Estimated Blood Loss:  Estimated blood loss: none. Estimated blood loss: none.      [facility]

## 2020-05-03 NOTE — Transfer of Care (Signed)
Immediate Anesthesia Transfer of Care Note  Patient: Juan Barron  Procedure(s) Performed: COLONOSCOPY WITH PROPOFOL (N/A )  Patient Location: Endoscopy Unit  Anesthesia Type:General  Level of Consciousness: drowsy and responds to stimulation  Airway & Oxygen Therapy: Patient Spontanous Breathing and Patient connected to face mask oxygen  Post-op Assessment: Report given to RN and Post -op Vital signs reviewed and stable  Post vital signs: Reviewed and stable  Last Vitals:  Vitals Value Taken Time  BP 109/65 05/03/20 0910  Temp 35.8 C 05/03/20 0910  Pulse 62 05/03/20 0912  Resp 14 05/03/20 0912  SpO2 99 % 05/03/20 0912  Vitals shown include unvalidated device data.  Last Pain:  Vitals:   05/03/20 0741  TempSrc: Temporal  PainSc: 0-No pain         Complications: No complications documented.

## 2020-05-03 NOTE — Anesthesia Postprocedure Evaluation (Signed)
Anesthesia Post Note  Patient: Juan Barron  Procedure(s) Performed: COLONOSCOPY WITH PROPOFOL (N/A )  Patient location during evaluation: Endoscopy Anesthesia Type: General Level of consciousness: awake and alert Pain management: pain level controlled Vital Signs Assessment: post-procedure vital signs reviewed and stable Respiratory status: spontaneous breathing and respiratory function stable Cardiovascular status: stable Anesthetic complications: no   No complications documented.   Last Vitals:  Vitals:   05/03/20 0930 05/03/20 0940  BP: (!) 177/83 (!) 169/98  Pulse: 67 71  Resp: (!) 21 15  Temp:    SpO2: 99% 100%    Last Pain:  Vitals:   05/03/20 0741  TempSrc: Temporal  PainSc: 0-No pain                 Keelynn Furgerson K

## 2020-05-03 NOTE — Anesthesia Preprocedure Evaluation (Signed)
Anesthesia Evaluation  Patient identified by MRN, date of birth, ID band Patient awake    Reviewed: Allergy & Precautions, NPO status , Patient's Chart, lab work & pertinent test results  History of Anesthesia Complications Negative for: history of anesthetic complications  Airway Mallampati: II       Dental   Pulmonary neg sleep apnea, neg COPD, Not current smoker,           Cardiovascular hypertension, Pt. on medications (-) Past MI and (-) CHF (-) dysrhythmias (-) Valvular Problems/Murmurs     Neuro/Psych neg Seizures Memory difficulties    GI/Hepatic Neg liver ROS, neg GERD  ,  Endo/Other  neg diabetes  Renal/GU negative Renal ROS     Musculoskeletal   Abdominal   Peds  Hematology   Anesthesia Other Findings   Reproductive/Obstetrics                             Anesthesia Physical Anesthesia Plan  ASA: II  Anesthesia Plan: General   Post-op Pain Management:    Induction: Intravenous  PONV Risk Score and Plan: 2 and Propofol infusion and TIVA  Airway Management Planned: Nasal Cannula  Additional Equipment:   Intra-op Plan:   Post-operative Plan:   Informed Consent: I have reviewed the patients History and Physical, chart, labs and discussed the procedure including the risks, benefits and alternatives for the proposed anesthesia with the patient or authorized representative who has indicated his/her understanding and acceptance.       Plan Discussed with:   Anesthesia Plan Comments:         Anesthesia Quick Evaluation

## 2020-05-03 NOTE — H&P (Signed)
Juan Lame, MD [place]ron., [place], [place] [id number] [phone] Fax : [phone]  Primary Care Physician:  Juan Ruths, MD Primary Gastroenterologist:  Dr. Allen Barron  Pre-Procedure History & Physical: HPI:  Juan Barron is a 79 y.o. male is here for an colonoscopy.   Past Medical History:  Diagnosis Date  . 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 HTN (hypertension), benign 05/16/2014   Overview:  years of "white coat HTN", started drug treatment in 07/2011 Last Assessment & Plan:  Taking medications without noted side effects or dizziness.    . Hyperlipidemia, unspecified 05/16/2014   Last Assessment & Plan:  Diet for healthy cholesterol is being attempted and no clear myalgia's or other side effects are noted.    . Incomplete emptying of bladder 05/27/2013  . Memory disturbance 07/22/2015   Overview:  Mild and seemingly not progressive  Last Assessment & Plan:  Memory is poor but not clearly progressive  . 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  MMSE 27/30.  MOCA on 08/25/13 was 20/30  . Post-void dribbling 05/27/2013  . Thyroid nodule 05/16/2014   Overview:  And subclinical hypothyroid  Last Assessment & Plan:  Tsh and energy are fairly stable.     Past Surgical History:  Procedure Laterality Date  . HYDROCELE EXCISION    . VASECTOMY  1977    Prior to Admission medications   Medication Sig Start Date End Date Taking? Authorizing Provider  amLODipine (NORVASC) 5 MG tablet  07/02/17  Yes [provider]  aspirin EC 81 MG tablet Take by mouth.   Yes [provider]  atropine 1 % ophthalmic solution Apply to eye. 02/02/20  Yes [provider]  brimonidine (ALPHAGAN) 0.2 % ophthalmic solution  01/25/20  Yes [provider]  CYCLOGYL 2 % ophthalmic solution Place 1 drop into the left eye 3 (three) times  daily. 01/25/20  Yes [provider]  ketorolac (ACULAR) 0.5 % ophthalmic solution INSTILL 1 DROP IN LEFT EYE FOUR TIMES DAILY. START 72 HOURS BEFORE SURGERY 01/26/20  Yes [provider]  Multiple Vitamins-Minerals (MULTIVITAMIN WITH MINERALS) tablet Take by mouth.   Yes [provider]  ofloxacin (OCUFLOX) 0.3 % ophthalmic solution INSTILL 1 DROP IN BOTH EYES FOUR TIMES DAILY. START 72 HOURS BEFORE SURGERY 01/25/20  Yes [provider]  prednisoLONE acetate (PRED FORTE) 1 % ophthalmic suspension INSTILL 1 DROP INTO LEFT EYE FOUR TIMES DAILY. USE ONLY AFTER SURGERY 01/26/20  Yes [provider]  tamsulosin (FLOMAX) 0.4 MG CAPS capsule Take 1 capsule (0.4 mg total) by mouth daily after breakfast. 10/02/19  Yes Stoioff, Ronda Fairly, MD    Allergies as of 03/30/2020  . (No Known Allergies)    Family History  Problem Relation Age of Onset  . Kidney cancer Sister   . Prostate cancer Neg Hx   . Bladder Cancer Neg Hx     Social History   Socioeconomic History  . Marital status: Married    Spouse name: Not on file  . Number of children: Not on file  . Years of education: Not on file  . Highest education level: Not on file  Occupational History  . Not on file  Tobacco Use  . Smoking status: Never Smoker  . Smokeless tobacco: Never Used  Vaping Use  . Vaping Use: Never used  Substance and Sexual Activity  . Alcohol use: No  . Drug use: No  . Sexual activity: Not on file  Other Topics Concern  . Not on file  Social History Narrative  . Not on file   Social Determinants of Health   Financial Resource Strain:   . Difficulty of Paying Living Expenses:   Food Insecurity:   . Worried About Charity fundraiser in the Last Year:   . Arboriculturist in the Last Year:   Transportation Needs:   . Film/video editor (Medical):   Marland Kitchen Lack of Transportation (Non-Medical):   Physical Activity:   . Days of Exercise per Week:   . Minutes of Exercise per  Session:   Stress:   . Feeling of Stress :   Social Connections:   . Frequency of Communication with Friends and Family:   . Frequency of Social Gatherings with Friends and Family:   . Attends Religious Services:   . Active Member of Clubs or Organizations:   . Attends Archivist Meetings:   Marland Kitchen Marital Status:   Intimate Partner Violence:   . Fear of Current or Ex-Partner:   . Emotionally Abused:   Marland Kitchen Physically Abused:   . Sexually Abused:     Review of Systems: See HPI, otherwise negative ROS  Physical Exam: BP (!) 150/91   Pulse 74   Temp (!) 96.5 F (35.8 C) (Temporal)   Resp 17   Ht 5\' 11"  (1.803 m)   Wt 65.8 kg   SpO2 99%   BMI 20.22 kg/m  General:   Alert,  pleasant and cooperative in NAD Head:  Normocephalic and atraumatic. Neck:  Supple; no masses or thyromegaly. Lungs:  Clear throughout to auscultation.    Heart:  Regular rate and rhythm. Abdomen:  Soft, nontender and nondistended. Normal bowel sounds, without guarding, and without rebound.   Neurologic:  Alert and  oriented x4;  grossly normal neurologically.  Impression/Plan: Juan Barron is here for an colonoscopy to be performed for history of adenomatous colon polyps in 2014  Risks, benefits, limitations, and alternatives regarding  colonoscopy have been reviewed with the patient.  Questions have been answered.  All parties agreeable.   Juan Lame, MD  05/03/2020, 8:46 AM

## 2020-05-04 ENCOUNTER — Encounter: Payer: Self-pay | Admitting: Gastroenterology

## 2020-05-04 LAB — SURGICAL PATHOLOGY

## 2020-08-04 DIAGNOSIS — E538 Deficiency of other specified B group vitamins: Secondary | ICD-10-CM | POA: Insufficient documentation

## 2020-09-27 ENCOUNTER — Other Ambulatory Visit: Payer: Self-pay | Admitting: Urology

## 2020-09-27 DIAGNOSIS — N401 Enlarged prostate with lower urinary tract symptoms: Secondary | ICD-10-CM

## 2020-09-30 ENCOUNTER — Other Ambulatory Visit (HOSPITAL_COMMUNITY): Payer: Self-pay | Admitting: Neurology

## 2020-09-30 DIAGNOSIS — R413 Other amnesia: Secondary | ICD-10-CM

## 2020-10-03 ENCOUNTER — Other Ambulatory Visit: Payer: Self-pay

## 2020-10-03 ENCOUNTER — Ambulatory Visit: Payer: Medicare PPO | Admitting: Urology

## 2020-10-03 ENCOUNTER — Encounter: Payer: Self-pay | Admitting: Urology

## 2020-10-03 VITALS — BP 128/80 | HR 80 | Ht 71.0 in | Wt 143.0 lb

## 2020-10-03 DIAGNOSIS — N401 Enlarged prostate with lower urinary tract symptoms: Secondary | ICD-10-CM | POA: Diagnosis not present

## 2020-10-03 DIAGNOSIS — R350 Frequency of micturition: Secondary | ICD-10-CM | POA: Diagnosis not present

## 2020-10-04 ENCOUNTER — Encounter: Payer: Self-pay | Admitting: Urology

## 2020-10-04 MED ORDER — TAMSULOSIN HCL 0.4 MG PO CAPS: ORAL_CAPSULE | ORAL | 3 refills | Status: AC

## 2020-10-04 NOTE — Progress Notes (Signed)
10/03/2020 3:46 PM   Juan Barron 01/02/1941 [id number]  Referring provider: Kirk Ruths, MD [facility] [place],  [place] [address]  Chief Complaint  Patient presents with   Benign Prostatic Hypertrophy    Urologic history: 1.BPH with lower urinary tract symptoms -On tamsulosin  HPI: 79 y.o. male presents for annual follow-up of BPH.   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     Hyperlipidemia, unspecified 05/16/2014   Last Assessment & Plan:  Diet for healthy cholesterol is being attempted and no clear myalgia's or other side effects are noted.     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  MMSE 27/30.  MOCA on 08/25/13 was 20/30   Post-void dribbling 05/27/2013   Thyroid nodule 05/16/2014   Overview:  And subclinical hypothyroid  Last Assessment & Plan:  Tsh and energy are fairly stable.     Surgical History: Past Surgical History:  Procedure Laterality Date   COLONOSCOPY WITH PROPOFOL N/A 05/03/2020   Procedure: COLONOSCOPY WITH PROPOFOL;  Surgeon: [name], MD;  Location: [facility] ENDOSCOPY;  Service: Endoscopy;  Laterality: N/A;   HYDROCELE EXCISION     VASECTOMY  1977    Home Medications:  Allergies as of 10/03/2020   No  Known Allergies     Medication List       Accurate as of October 03, 2020 11:59 PM. If you have any questions, ask your nurse or doctor.        STOP taking these medications   atropine 1 % ophthalmic solution Stopped by: Abbie Sons, MD   brimonidine 0.2 % ophthalmic solution Commonly known as: ALPHAGAN Stopped by: Abbie Sons, MD   Cyclogyl 2 % ophthalmic solution Generic drug: cyclopentolate Stopped by: Abbie Sons, MD   ketorolac 0.5 % ophthalmic solution Commonly known as: ACULAR Stopped by: Abbie Sons, MD   ofloxacin 0.3 % ophthalmic solution Commonly known as: OCUFLOX Stopped by: Abbie Sons, MD   prednisoLONE acetate 1 % ophthalmic suspension Commonly known as: PRED FORTE Stopped by: Abbie Sons, MD     TAKE these medications   amLODipine 5 MG tablet Commonly known as: NORVASC   aspirin EC 81 MG tablet Take by mouth.   cyanocobalamin 1000 MCG tablet Take by mouth.   donepezil 5 MG tablet Commonly known as: ARICEPT Take by mouth.   levothyroxine 50 MCG tablet Commonly known as: SYNTHROID Take by mouth.   multivitamin with minerals tablet Take by mouth.   tamsulosin 0.4 MG Caps capsule Commonly known as: FLOMAX TAKE 1 CAPSULE(0.4 MG) BY MOUTH DAILY AFTER BREAKFAST       Allergies: No Known Allergies  Family History: Family History  Problem Relation Age of Onset   Kidney cancer  Sister    Prostate cancer Neg Hx    Bladder Cancer Neg Hx     Social History:  reports that he has never smoked. He has never used smokeless tobacco. He reports that he does not drink alcohol and does not use drugs.   Physical Exam: BP 128/80    Pulse 80    Ht 5\' 11"  (1.803 m)    Wt 143 lb (64.9 kg)    BMI 19.94 kg/m   Constitutional:  Alert, No acute distress. HEENT: [place] AT, moist mucus membranes.  Trachea midline, no masses. Cardiovascular: No clubbing, cyanosis, or edema. Respiratory: Normal respiratory effort, no increased  work of breathing. Skin: No rashes, bruises or suspicious lesions. Neurologic: Grossly intact, no focal deficits, moving all 4 extremities. Psychiatric: Normal mood and affect.   Assessment & Plan:    1.  BPH with lower urinary tract symptoms  Stable voiding symptoms on tamsulosin  Refill sent to pharmacy  We discussed annual follow-up or having Dr. Ouida Sills take over his tamsulosin refills and returning here prn.  They will ask Dr. Ouida Sills at their next visit.   Abbie Sons, [place] [address], [place], [place] [address] [phone]

## 2020-10-07 ENCOUNTER — Other Ambulatory Visit: Payer: Self-pay

## 2020-10-07 ENCOUNTER — Ambulatory Visit (HOSPITAL_COMMUNITY)
Admission: RE | Admit: 2020-10-07 | Discharge: 2020-10-07 | Disposition: A | Discharge status: Home / Self Care | Payer: Medicare PPO | Source: Ambulatory Visit | Attending: Neurology | Admitting: Neurology

## 2020-10-07 DIAGNOSIS — R413 Other amnesia: Secondary | ICD-10-CM | POA: Diagnosis present

## 2020-10-07 MED ORDER — GADOBUTROL 1 MMOL/ML IV SOLN
6.5000 mL | Freq: Once | INTRAVENOUS | Status: AC | PRN
  Administered 2020-10-07: 6.5 mL via INTRAVENOUS

## 2021-06-25 IMAGING — MR MR HEAD WO/W CM
10 of 14 series · 29 of 48 positions shown · IV contrast (6.5 M GAD)
Comparison: MR head without and with contrast 08/03/2011

CLINICAL DATA: Memory loss.

EXAM:
MRI HEAD WITHOUT AND WITH CONTRAST
TECHNIQUE: Multiplanar, multiecho pulse sequences of the brain and surrounding
structures were obtained without and with intravenous contrast.
CONTRAST:  6.5mL GADAVIST GADOBUTROL 1 MMOL/ML IV SOLN

[Series 2: FLAIR · sagittal · 5.0mm · 0.47mm/px · 2 of 29 slices shown (1 of 2)]
[im 1/29]
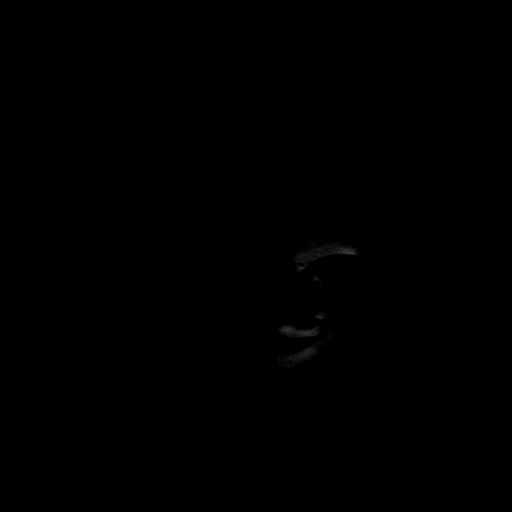
[im 29/29]
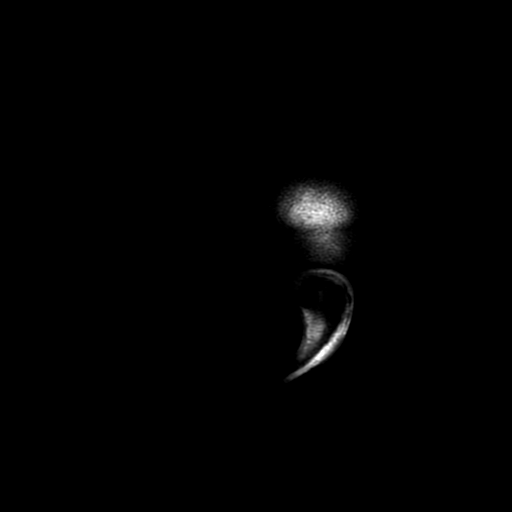

[Series 3: DWI · axial · 3.0mm · 0.94mm/px · z∈[-25,+142]mm · 6 of 114 slices shown (1 of 2)]
[im 1/114]
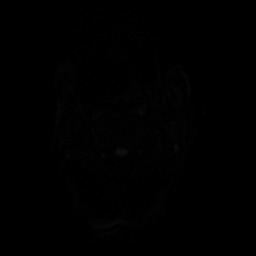
[im 23/114]
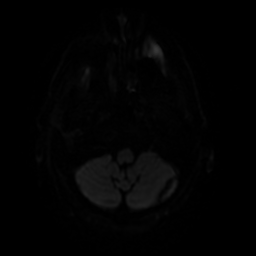
[im 46/114]
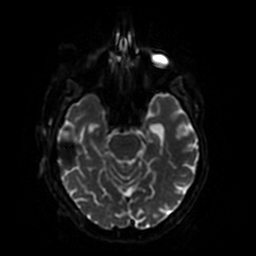
[im 68/114]
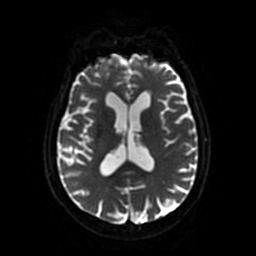
[im 91/114]
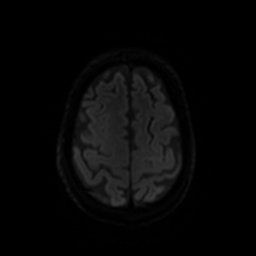
[im 114/114]
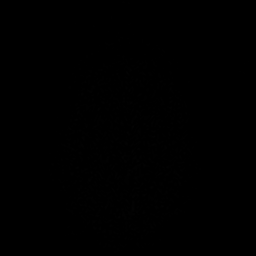

[Series 4: T2 · axial · 5.0mm · 0.47mm/px · 1 of 29 slices shown]
[im 1/29]
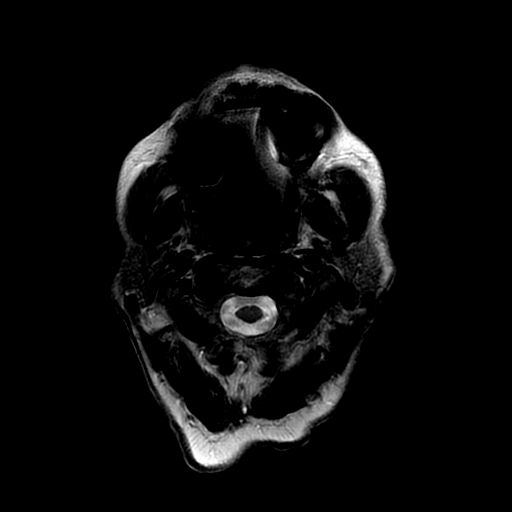

[Series 5: FLAIR · axial · 4.0mm · 0.45mm/px · z∈[-19,+144]mm · 2 of 31 slices shown (2 of 2)]
[im 1/31]
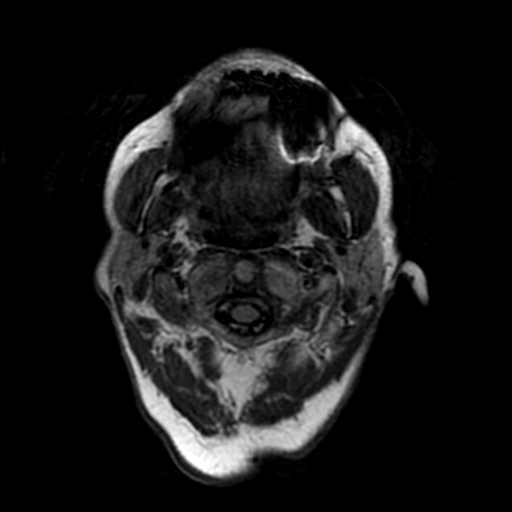
[im 31/31]
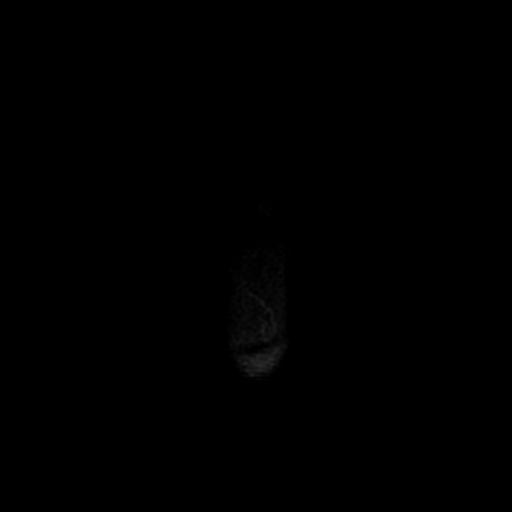

[Series 6: DWI · coronal · 4.0mm · 0.94mm/px · 4 of 86 slices shown (2 of 2)]
[im 1/86]
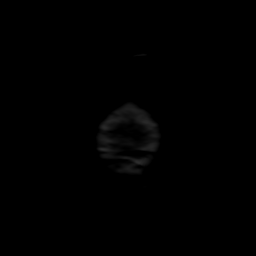
[im 29/86]
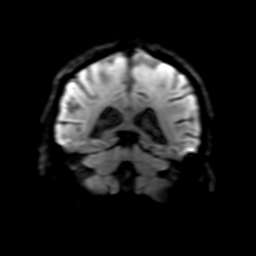
[im 57/86]
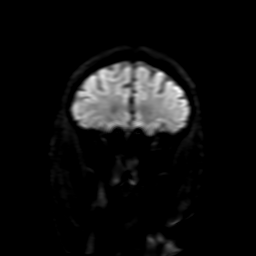
[im 86/86]
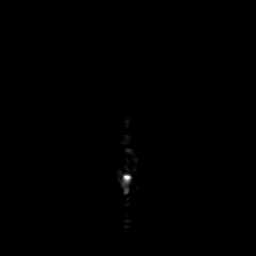

[Series 7: SWI · axial · 3.0mm · 0.47mm/px · z∈[-17,+142]mm · 5 of 108 slices shown]
[im 1/108]
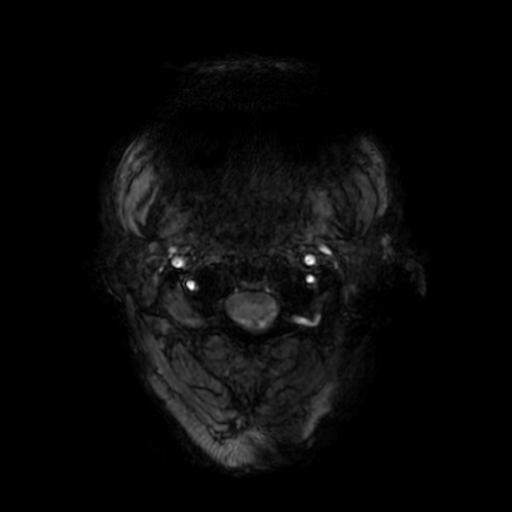
[im 27/108]
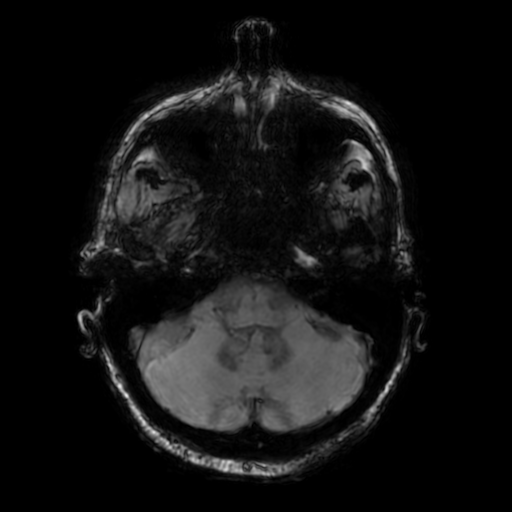
[im 54/108]
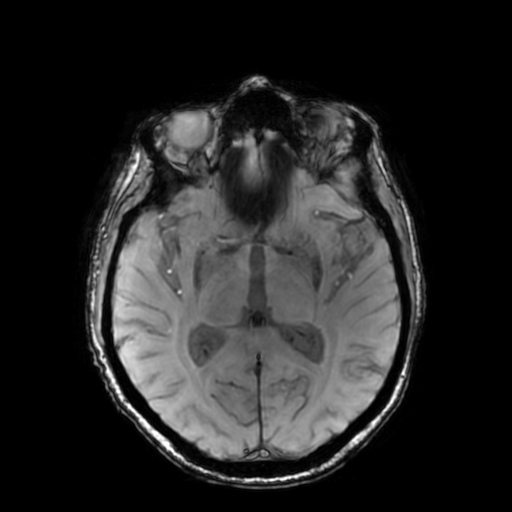
[im 81/108]
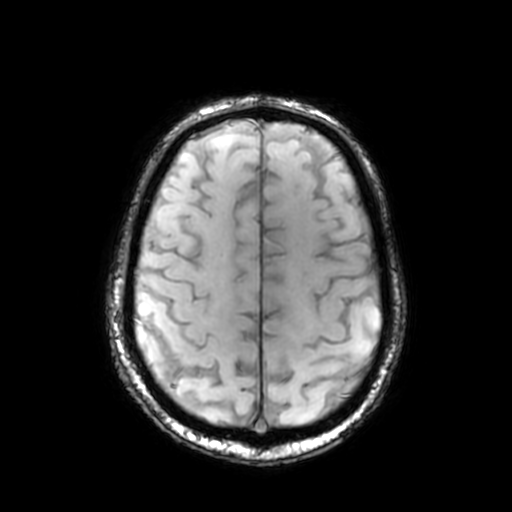
[im 108/108]
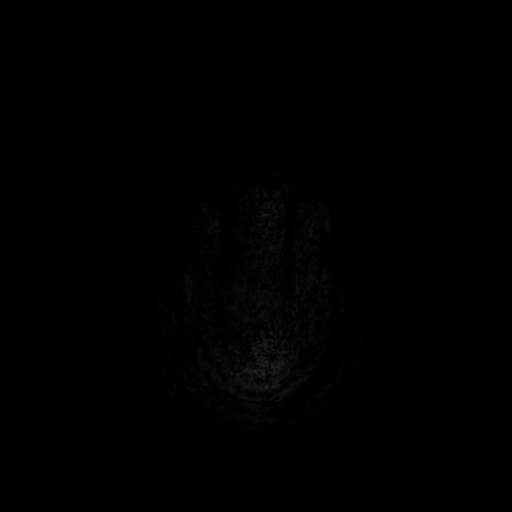

[Series 10: T2 post-contrast · coronal · 5.0mm · 0.39mm/px · 2 of 35 slices shown]
[im 1/35]
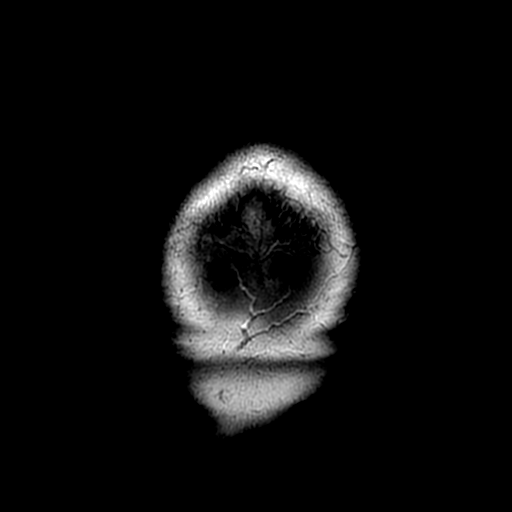
[im 35/35]
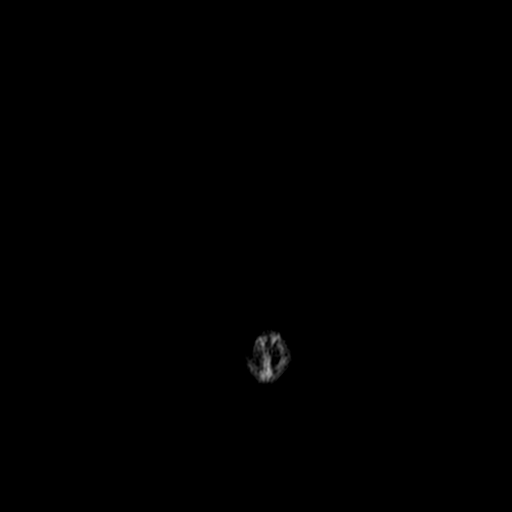

[Series 12: T1 post-contrast · coronal · 5.0mm · 0.39mm/px · 2 of 36 slices shown]
[im 1/36]
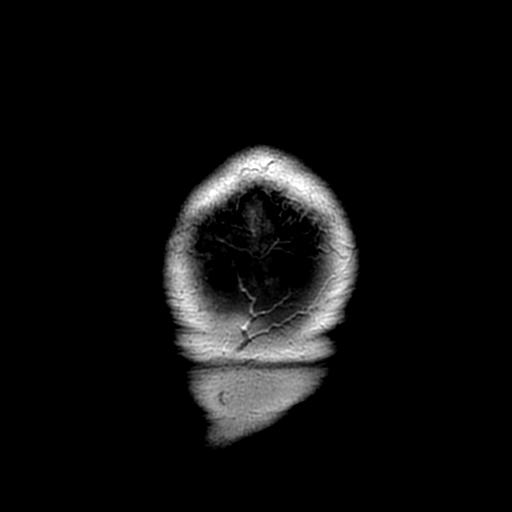
[im 36/36]
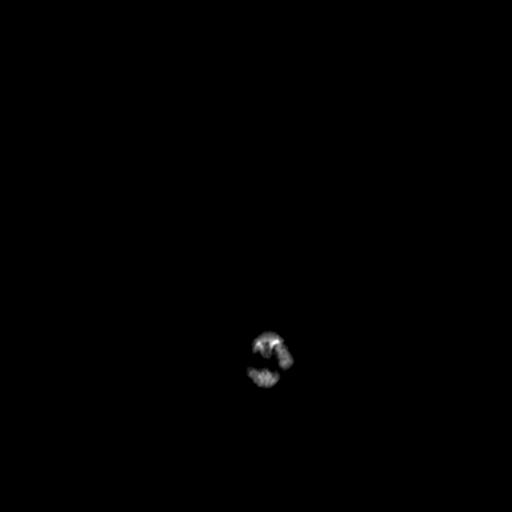

[Series 350: ADC · axial · 3.0mm · 0.94mm/px · z∈[-25,+142]mm · 3 of 57 slices shown (1 of 2)]
[im 1/57]
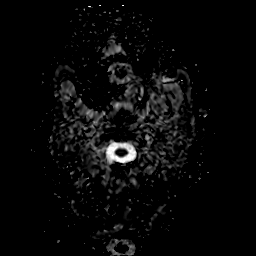
[im 29/57]
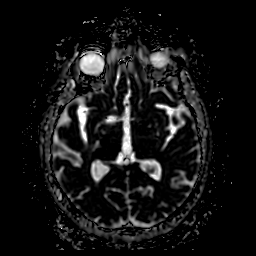
[im 57/57]
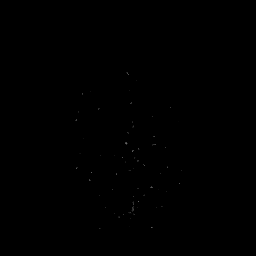

[Series 650: ADC · coronal · 4.0mm · 0.94mm/px · 2 of 43 slices shown (2 of 2)]
[im 1/43]
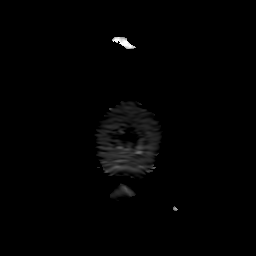
[im 43/43]
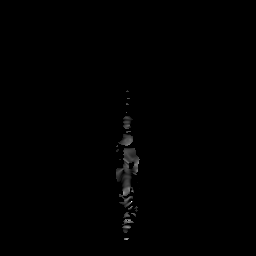

[29 of 48 positions shown; findings below may reference images not displayed]

FINDINGS: Brain: Progressive generalized atrophy is compared to the prior
exam. Most notable is bilateral hippocampal atrophy with increased
prominence of the inferolateral ventricles. No significant white
matter disease is present. Ventricles are proportionate to the
degree of atrophy. No significant extraaxial fluid collection is
present.

The internal auditory canals are within normal limits. The brainstem
and cerebellum are within normal limits.

Postcontrast images demonstrate no pathologic enhancement.

Vascular: Flow is present in the major intracranial arteries.

Skull and upper cervical spine: The craniocervical junction is
normal. Upper cervical spine is within normal limits. Marrow signal
is unremarkable.

Sinuses/Orbits: The paranasal sinuses and mastoid air cells are
clear. Bilateral lens replacements are noted. Globes and orbits are
otherwise unremarkable.

Other: NeuroQuant evaluation was. Whole brain volume is within
normal limits for age. Lateral ventricle size is within limits for
age. Cortical gray matter is at the twelfth percentile. Conversely,
cerebral white matter is at the eighty-third percentile. Hippocampal
structures are greater than 2 standard deviations below the mean and
size of the inferolateral ventricles are greater than 2 standard
deviations above the mean.
IMPRESSION: 1. Progressive generalized atrophy, most notable in the hippocampal
structures. Hippocampal atrophy can be associated with memory loss.
2. NeuroQuant evaluation demonstrates normal flow brain volume for
age, but disproportionate cortical atrophy.
3. Most striking finding in the NeuroQuant analysis is marked
hippocampal atrophy relative to age. Subjectively, there has been
significant progression of the temporal atrophy since [DATE].

## 2022-08-08 ENCOUNTER — Other Ambulatory Visit: Payer: Self-pay | Admitting: Student

## 2022-08-08 DIAGNOSIS — R55 Syncope and collapse: Secondary | ICD-10-CM

## 2022-08-08 DIAGNOSIS — R413 Other amnesia: Secondary | ICD-10-CM

## 2022-08-16 ENCOUNTER — Other Ambulatory Visit: Payer: Self-pay | Admitting: Student

## 2022-08-16 DIAGNOSIS — R55 Syncope and collapse: Secondary | ICD-10-CM

## 2022-08-27 ENCOUNTER — Ambulatory Visit
Admission: RE | Admit: 2022-08-27 | Discharge: 2022-08-27 | Disposition: A | Discharge status: Home / Self Care | Payer: Medicare PPO | Source: Ambulatory Visit | Attending: Student | Admitting: Student

## 2022-08-27 DIAGNOSIS — R55 Syncope and collapse: Secondary | ICD-10-CM | POA: Insufficient documentation

## 2022-09-06 ENCOUNTER — Ambulatory Visit
Admission: RE | Admit: 2022-09-06 | Discharge: 2022-09-06 | Disposition: A | Discharge status: Home / Self Care | Payer: Medicare PPO | Source: Ambulatory Visit | Attending: Student | Admitting: Student

## 2022-09-06 DIAGNOSIS — R55 Syncope and collapse: Secondary | ICD-10-CM | POA: Insufficient documentation

## 2022-09-06 DIAGNOSIS — R413 Other amnesia: Secondary | ICD-10-CM | POA: Insufficient documentation

## 2023-02-26 ENCOUNTER — Ambulatory Visit
Admission: RE | Admit: 2023-02-26 | Discharge: 2023-02-26 | Disposition: A | Discharge status: Home / Self Care | Payer: Medicare PPO | Source: Ambulatory Visit | Attending: Internal Medicine | Admitting: Internal Medicine

## 2023-02-26 ENCOUNTER — Other Ambulatory Visit: Payer: Self-pay | Admitting: Internal Medicine

## 2023-02-26 DIAGNOSIS — R634 Abnormal weight loss: Secondary | ICD-10-CM | POA: Diagnosis not present

## 2023-02-26 DIAGNOSIS — R160 Hepatomegaly, not elsewhere classified: Secondary | ICD-10-CM

## 2023-03-26 ENCOUNTER — Other Ambulatory Visit: Payer: Self-pay

## 2023-03-27 ENCOUNTER — Encounter: Payer: Self-pay | Admitting: Physician Assistant

## 2023-03-27 ENCOUNTER — Ambulatory Visit: Payer: Medicare PPO | Admitting: Physician Assistant

## 2023-03-27 VITALS — BP 136/70 | HR 62 | Temp 98.3°F | Ht 71.0 in | Wt 147.0 lb

## 2023-03-27 DIAGNOSIS — K769 Liver disease, unspecified: Secondary | ICD-10-CM

## 2023-03-27 DIAGNOSIS — K5730 Diverticulosis of large intestine without perforation or abscess without bleeding: Secondary | ICD-10-CM

## 2023-03-27 NOTE — Progress Notes (Signed)
Celso Amy, PA-C [address]  [address]  [place], [place] [address]  Main: [phone]  Fax: [phone]   Gastroenterology Consultation  Referring Provider:     Lauro Regulus, MD Primary Care Physician:  Lauro Regulus, MD Primary Gastroenterologist:  Dr. Midge Minium  Reason for Consultation:     Diverticulosis; Liver Lesions        HPI:   Juan Barron is a 82 y.o. y/o male referred for consultation & management  by Lauro Regulus, MD.    Patient underwent CT of the chest, abdomen, and pelvis without contrast 02/26/23 to evaluate unintentional weight loss.  There was no acute abnormality to explain weight loss.  Incidental multiple hepatic cysts with tiny low-density liver lesions, too small to characterize.  Was recommended for patient to have follow-up abdominal MRI to further evaluate liver lesions.  There was incidental diverticulosis with NO diverticulitis.  Patient is here today with his wife.  Patient states he is not having any GI symptoms.  Specifically, he denies weight loss, abdominal pain, diarrhea, constipation, melena, or hematochezia.  Weight is stable.  He has history of colon polyps.  Had multiple previous colonoscopies done in 2001, 2004, 2009, 2015, 2021 by Dr. Servando Snare.  Last colonoscopy 04/2020 by Dr. Servando Snare showed 1 small 5 mm tubular adenoma polyp removed from descending colon.  Sigmoid diverticulosis.  Excellent prep.  Last labs 02/27/2023 showed normal LFTs, mild chronic kidney disease with BUN 21, creatinine 1.3, GFR 55.  Mildly elevated TSH 5.476.  CBC 07/2022 was normal with hemoglobin 14.3g.  Normal B12.  Patient does not have any metal in his body.  He is not claustrophobic.  Does not have a pacemaker.    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     Hyperlipidemia, unspecified 05/16/2014   Last Assessment & Plan:  Diet for healthy cholesterol is being attempted and no clear myalgia's or other side effects are noted.     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  MMSE 27/30.  MOCA on 08/25/13 was 20/30   Post-void dribbling 05/27/2013   Thyroid nodule 05/16/2014   Overview:  And subclinical hypothyroid  Last Assessment & Plan:  Tsh and energy are fairly stable.     Past Surgical History:  Procedure Laterality Date   COLONOSCOPY WITH PROPOFOL N/A 05/03/2020   Procedure: COLONOSCOPY WITH PROPOFOL;  Surgeon: Midge Minium, MD;  Location: [facility] ENDOSCOPY;  Service: Endoscopy;  Laterality: N/A;   HYDROCELE EXCISION     VASECTOMY  1977    Prior to Admission medications   Medication Sig Start Date End Date Taking? Authorizing Provider  amLODipine (NORVASC) 5 MG tablet  07/02/17   [provider]  aspirin EC 81 MG tablet Take by mouth.    [provider]  cyanocobalamin 1000 MCG tablet Take by mouth.    [provider]  Docusate Sodium (DSS) 100 MG CAPS Take by  mouth. 08/25/13   [provider]  donepezil (ARICEPT) 5 MG tablet Take by mouth. 09/30/20 10/30/21  [provider]  levothyroxine (SYNTHROID) 50 MCG tablet Take by mouth. 08/04/20 08/04/21  [provider]  Multiple Vitamins-Minerals (MULTIVITAMIN WITH MINERALS) tablet Take by mouth.    [provider]  Omega 3-6-9 Fatty Acids (OMEGA 3-6-9 PO) Take by mouth. 08/28/11   [provider]  tamsulosin (FLOMAX) 0.4 MG CAPS capsule TAKE 1 CAPSULE(0.4 MG) BY MOUTH DAILY AFTER  BREAKFAST 10/04/20   Stoioff, Verna Czech, MD    Family History  Problem Relation Age of Onset   Kidney cancer Sister    Prostate cancer Neg Hx    Bladder Cancer Neg Hx      Social History   Tobacco Use   Smoking status: Never   Smokeless tobacco: Never  Vaping Use   Vaping Use: Never used  Substance Use Topics   Alcohol use: No   Drug use: No    Allergies as of 03/27/2023   (No Known Allergies)    Review of Systems:    All systems reviewed and negative except where noted in HPI.   Physical Exam:  There were no vitals taken for this visit. No LMP for male patient. Psych:  Alert and cooperative. Normal mood and affect. General:   Alert,  Well-developed, well-nourished, pleasant and cooperative in NAD Head:  Normocephalic and atraumatic. Eyes:  Sclera clear, no icterus.   Conjunctiva pink. Ears:  Normal auditory acuity. Neck:  Supple; no masses or thyromegaly. Lungs:  Respirations even and unlabored.  Clear throughout to auscultation.   No wheezes, crackles, or rhonchi. No acute distress. Heart:  Regular rate and rhythm; no murmurs, clicks, rubs, or gallops. Abdomen:  Normal bowel sounds.  No bruits.  Soft, non-tender and non-distended without masses, hepatosplenomegaly or hernias noted.  No guarding or rebound tenderness.    Neurologic:  Alert and oriented x3;  grossly normal neurologically. Psych:  Alert and cooperative. Normal mood and affect.  Imaging Studies: CT CHEST ABDOMEN PELVIS WO CONTRAST  Result Date: 02/26/2023 CLINICAL DATA:  Unexplained weight loss. EXAM: CT CHEST, ABDOMEN AND PELVIS WITHOUT CONTRAST TECHNIQUE: Multidetector CT imaging of the chest, abdomen and pelvis was performed following the standard protocol without IV contrast. RADIATION DOSE REDUCTION: This exam was performed according to the departmental dose-optimization program which includes automated exposure control, adjustment of the mA and/or kV according to patient size and/or use of iterative  reconstruction technique. COMPARISON:  None Available. FINDINGS: CT CHEST FINDINGS Cardiovascular: The heart size is normal. No substantial pericardial effusion. Coronary artery calcification is evident. Mild atherosclerotic calcification is noted in the wall of the thoracic aorta. Mediastinum/Nodes: No mediastinal lymphadenopathy. No evidence for gross hilar lymphadenopathy although assessment is limited by the lack of intravenous contrast on the current study. The esophagus has normal imaging features. There is no axillary lymphadenopathy. Lungs/Pleura: Linear atelectasis or scarring is noted in both lung bases. No focal airspace consolidation. No suspicious pulmonary nodule or mass. No pleural effusion. Musculoskeletal: No worrisome lytic or sclerotic osseous abnormality. CT ABDOMEN PELVIS FINDINGS Hepatobiliary: Multiple hepatic cysts noted with dominant lesion measuring 7.6 cm in the left hepatic lobe. Multiple tiny low-density liver lesions are too small to characterize. There is no evidence for gallstones, gallbladder wall thickening, or pericholecystic fluid. No intrahepatic or extrahepatic biliary dilation. Pancreas: No focal mass lesion. No dilatation of the main duct. No intraparenchymal cyst. No peripancreatic edema. Spleen: No splenomegaly. No focal mass lesion.  Adrenals/Urinary Tract: No adrenal nodule or mass. Right kidney unremarkable. Probable central sinus cysts in the left kidney No evidence for hydroureter. The urinary bladder appears normal for the degree of distention. Stomach/Bowel: Stomach is unremarkable. No gastric wall thickening. No evidence of outlet obstruction. Duodenum is normally positioned as is the ligament of Treitz. No small bowel wall thickening. No small bowel dilatation. The appendix is not well visualized, but there is no edema or inflammation in the region of the cecum. No gross colonic mass. No colonic wall thickening. Diverticular changes are noted in the left colon  without evidence of diverticulitis. Vascular/Lymphatic: There is mild atherosclerotic calcification of the abdominal aorta without aneurysm. There is no gastrohepatic or hepatoduodenal ligament lymphadenopathy. No retroperitoneal or mesenteric lymphadenopathy. No pelvic sidewall lymphadenopathy. Reproductive: The prostate gland and seminal vesicles are unremarkable. Other: No intraperitoneal free fluid. Musculoskeletal: No worrisome lytic or sclerotic osseous abnormality. IMPRESSION: 1. No acute findings in the chest, abdomen, or pelvis. Specifically, no findings to explain the patient's history of weight loss. 2. Multiple hepatic cysts with tiny low-density liver lesions too small to characterize. These tiny lesions are also most likely benign, but given the history of weight loss, abdominal MRI with and without contrast may be warranted to further evaluate. 3. Left colonic diverticulosis without diverticulitis. 4.  Aortic Atherosclerosis (ICD10-I70.0). Electronically Signed   By: Kennith Center M.D.   On: 02/26/2023 14:10    Assessment and Plan:   CORDARRELL OLASCOAGA is a 82 y.o. y/o male has been referred for evaluation of liver lesions and weight loss.  Patient states he has not had any weight loss.  Weight has been stable.  He denies any GI symptoms.  Recent chest, abdomen, and pelvic CT showed Incidental multiple hepatic cysts with tiny low-density liver lesions, too small to characterize.  Also had incidental sigmoid diverticulosis.  No evidence of diverticulitis.  Liver Lesions -looks like benign hepatic cysts on CT (without contrast).  No previous imaging to compare.   Ordering follow-up abdominal liver MRI with and without contrast for further evaluation.   Reassurance.  2.  Diverticulosis  Patient education discussed.  Reassurance.  Has not had any abdominal pain or tenderness on exam.  No evidence of diverticulitis.  Follow up in As needed based on Abdominal MRI Results.  Also f/u if he develops  any GI symptoms.  Celso Amy, PA-C

## 2023-03-27 NOTE — Patient Instructions (Addendum)
MRI Abdomen scheduled 04/09/23 Arrive at 8:15. Nothing to eat/drink 4 hours prior. @ [facility],   Follow up as needed.

## 2023-04-09 ENCOUNTER — Ambulatory Visit
Admission: RE | Admit: 2023-04-09 | Discharge: 2023-04-09 | Disposition: A | Discharge status: Home / Self Care | Payer: Medicare PPO | Source: Ambulatory Visit | Attending: Physician Assistant | Admitting: Physician Assistant

## 2023-04-09 DIAGNOSIS — K769 Liver disease, unspecified: Secondary | ICD-10-CM | POA: Diagnosis present

## 2023-04-09 MED ORDER — GADOBUTROL 1 MMOL/ML IV SOLN
6.0000 mL | Freq: Once | INTRAVENOUS | Status: AC | PRN
  Administered 2023-04-09: 6 mL via INTRAVENOUS

## 2023-04-09 NOTE — Progress Notes (Signed)
Call and notify patient abdominal MRI shows numerous benign liver cysts.  No evidence of solid masses or worrisome lesions.  No gallstones.  Normal pancreas, spleen, stomach and intestines.  No further evaluation is recommended.  Reassurance.  After patient notified, forward result to Dr. Servando Snare to review. -Celso Amy, PA-C

## 2023-04-10 ENCOUNTER — Telehealth: Payer: Self-pay

## 2023-04-10 NOTE — Telephone Encounter (Signed)
Patient notified.    Call and notify patient abdominal MRI shows numerous benign liver cysts.  No evidence of solid masses or worrisome lesions.  No gallstones.  Normal pancreas, spleen, stomach and intestines.  No further evaluation is recommended.  Reassurance.  After patient notified, forward result to Dr. Servando Snare to review.

## 2023-04-10 NOTE — Telephone Encounter (Signed)
   Patient notified.  Call and notify patient abdominal MRI shows numerous benign liver cysts.  No evidence of solid masses or worrisome lesions.  No gallstones.  Normal pancreas, spleen, stomach and intestines.  No further evaluation is recommended.  Reassurance.  After patient notified, forward result to Dr. Servando Snare to review.

## 2023-12-24 ENCOUNTER — Emergency Department
Admission: EM | Admit: 2023-12-24 | Discharge: 2023-12-24 | Disposition: A | Discharge status: Home / Self Care | Payer: Medicare PPO | Attending: Emergency Medicine | Admitting: Emergency Medicine

## 2023-12-24 DIAGNOSIS — R55 Syncope and collapse: Secondary | ICD-10-CM | POA: Insufficient documentation

## 2023-12-24 DIAGNOSIS — I10 Essential (primary) hypertension: Secondary | ICD-10-CM | POA: Diagnosis not present

## 2023-12-24 DIAGNOSIS — D72829 Elevated white blood cell count, unspecified: Secondary | ICD-10-CM | POA: Diagnosis not present

## 2023-12-24 LAB — COMPREHENSIVE METABOLIC PANEL
ALT: 14 U/L (ref 0–44)
AST: 14 U/L — ABNORMAL LOW (ref 15–41)
Albumin: 3.4 g/dL — ABNORMAL LOW (ref 3.5–5.0)
Alkaline Phosphatase: 68 U/L (ref 38–126)
Anion gap: 7 (ref 5–15)
BUN: 25 mg/dL — ABNORMAL HIGH (ref 8–23)
CO2: 25 mmol/L (ref 22–32)
Calcium: 7.8 mg/dL — ABNORMAL LOW (ref 8.9–10.3)
Chloride: 107 mmol/L (ref 98–111)
Creatinine, Ser: 1.13 mg/dL (ref 0.61–1.24)
GFR, Estimated: >60 mL/min (ref >60)
Glucose, Bld: 146 mg/dL — ABNORMAL HIGH (ref 70–99)
Potassium: 3.8 mmol/L (ref 3.5–5.1)
Sodium: 139 mmol/L (ref 135–145)
Total Bilirubin: 1.1 mg/dL (ref 0.0–1.2)
Total Protein: 5.8 g/dL — ABNORMAL LOW (ref 6.5–8.1)

## 2023-12-24 LAB — CBC
HCT: 39.6 % (ref 39.0–52.0)
Hemoglobin: 13.4 g/dL (ref 13.0–17.0)
MCH: 31.2 pg (ref 26.0–34.0)
MCHC: 33.8 g/dL (ref 30.0–36.0)
MCV: 92.3 fL (ref 80.0–100.0)
Platelets: 215 10*3/uL (ref 150–400)
RBC: 4.29 MIL/uL (ref 4.22–5.81)
RDW: 12.7 % (ref 11.5–15.5)
WBC: 15.1 10*3/uL — ABNORMAL HIGH (ref 4.0–10.5)
nRBC: 0 % (ref 0.0–0.2)

## 2023-12-24 LAB — TROPONIN I (HIGH SENSITIVITY): Troponin I (High Sensitivity): 6 ng/L (ref <18)

## 2023-12-24 MED ORDER — SODIUM CHLORIDE 0.9 % IV BOLUS
500.0000 mL | Freq: Once | INTRAVENOUS | Status: AC
  Administered 2023-12-24: 500 mL via INTRAVENOUS

## 2023-12-24 NOTE — ED Triage Notes (Signed)
 Pt arrived via [facility]. Pt had a syncopal episode in restaurant.   Initial BP on EMS arrival 60/40 and HR 56. Pt was A&Ox4 at that time. Pt was initially bradycardic. Last BP by EMS was 132/63 with HR in the 70's and NSR.  900mL NaCl bolus given by EMS. 18g left AC. Hx of mild cognitive issues. BG 167

## 2023-12-24 NOTE — ED Notes (Signed)
 Called [facility] for cardiac monitoring

## 2023-12-24 NOTE — ED Provider Notes (Signed)
 [facility] Provider Note    Event Date/Time   First MD Initiated Contact with Patient 12/24/23 1344     (approximate)   History   Loss of Consciousness   HPI  Juan Barron is a 83 y.o. male with a history of hypertension, mild cognitive impairment who presents after a syncopal episode.  Per EMS patient was in a restaurant had syncopal episode, initially was hypotensive but responding appropriately, they did give IV fluids with significant improvement.  Currently the patient feels quite well and has no complaints.  No chest pain palpitations abdominal pain nausea or vomiting     Physical Exam   Triage Vital Signs: ED Triage Vitals  Encounter Vitals Group     BP 12/24/23 1343 (!) 140/74     Systolic BP Percentile --      Diastolic BP Percentile --      Pulse Rate 12/24/23 1343 75     Resp 12/24/23 1343 18     Temp 12/24/23 1343 97.9 F (36.6 C)     Temp Source 12/24/23 1343 Oral     SpO2 12/24/23 1343 95 %     Weight 12/24/23 1350 69 kg (152 lb 1.9 oz)     Height 12/24/23 1350 1.803 m (5' 11)     Head Circumference --      Peak Flow --      Pain Score 12/24/23 1350 0     Pain Loc --      Pain Education --      Exclude from Growth Chart --     Most recent vital signs: Vitals:   12/24/23 1343 12/24/23 1430  BP: (!) 140/74 (!) 154/78  Pulse: 75 88  Resp: 18 18  Temp: 97.9 F (36.6 C)   SpO2: 95% 97%     General: Awake, no distress.  CV:  Good peripheral perfusion.  Regular rate and rhythm Resp:  Normal effort.  Clear to auscultation Abd:  No distention.  Soft, nontender Other:     ED Results / Procedures / Treatments   Labs (all labs ordered are listed, but only abnormal results are displayed) Labs Reviewed  CBC - Abnormal; Notable for the following components:      Result Value   WBC 15.1 (*)    All other components within normal limits  COMPREHENSIVE METABOLIC PANEL - Abnormal; Notable for the following components:    Glucose, Bld 146 (*)    BUN 25 (*)    Calcium 7.8 (*)    Total Protein 5.8 (*)    Albumin 3.4 (*)    AST 14 (*)    All other components within normal limits  TROPONIN I (HIGH SENSITIVITY)     EKG  ED ECG REPORT I, Lamar Price, the attending physician, personally viewed and interpreted this ECG.  Date: 12/24/2023  Rhythm: normal sinus rhythm QRS Axis: normal Intervals: IVCD ST/T Wave abnormalities: normal Narrative Interpretation: no evidence of acute ischemia    RADIOLOGY     PROCEDURES:  Critical Care performed:   Procedures   MEDICATIONS ORDERED IN ED: Medications  sodium chloride  0.9 % bolus 500 mL (0 mLs Intravenous Stopped 12/24/23 1449)     IMPRESSION / MDM / ASSESSMENT AND PLAN / ED COURSE  I reviewed the triage vital signs and the nursing notes. Patient's presentation is most consistent with acute presentation with potential threat to life or bodily function.  Patient presents after syncopal episode as detailed above, overall well-appearing  here and asymptomatic, differential includes vasovagal syncope, dehydration, kidney failure, doubt arrhythmia or ACS given no symptoms  EKG is reassuring, high sensitive troponin is normal.  Mild elevation of BUN to creatinine ratio consistent with dehydration, IV fluids are infusing  Elevated white blood cell count which is nonspecific  Will obtain orthostatics  Orthostatics are reassuring, patient reports he feels great, discussed with family unclear etiology but overall reassuring workup they feel comfortable with discharge, they will return if any worsening, outpatient follow-up PCP recommended      FINAL CLINICAL IMPRESSION(S) / ED DIAGNOSES   Final diagnoses:  Syncope and collapse     Rx / DC Orders   ED Discharge Orders     None        Note:  This document was prepared using Dragon voice recognition software and may include unintentional dictation errors.   Arlander Charleston,  MD 12/24/23 1520

## 2024-01-28 ENCOUNTER — Other Ambulatory Visit: Payer: Self-pay | Admitting: Internal Medicine

## 2024-01-28 DIAGNOSIS — I429 Cardiomyopathy, unspecified: Secondary | ICD-10-CM

## 2024-01-28 DIAGNOSIS — R0602 Shortness of breath: Secondary | ICD-10-CM

## 2024-01-31 ENCOUNTER — Telehealth (HOSPITAL_COMMUNITY): Payer: Self-pay | Admitting: Emergency Medicine

## 2024-01-31 NOTE — Telephone Encounter (Signed)
 Reaching out to patient to offer assistance regarding upcoming cardiac imaging study; pt verbalizes understanding of appt date/time, parking situation and where to check in, pre-test NPO status and medications ordered, and verified current allergies; name and call back number provided for further questions should they arise Rockwell Alexandria RN Navigator Cardiac Imaging [facility] [phone] office [phone] cell

## 2024-02-03 ENCOUNTER — Ambulatory Visit
Admission: RE | Admit: 2024-02-03 | Discharge: 2024-02-03 | Disposition: A | Discharge status: Home / Self Care | Source: Ambulatory Visit | Attending: Internal Medicine | Admitting: Internal Medicine

## 2024-02-03 DIAGNOSIS — R0602 Shortness of breath: Secondary | ICD-10-CM | POA: Diagnosis present

## 2024-02-03 DIAGNOSIS — I429 Cardiomyopathy, unspecified: Secondary | ICD-10-CM | POA: Diagnosis present

## 2024-02-03 MED ORDER — NITROGLYCERIN 0.4 MG SL SUBL
0.8000 mg | SUBLINGUAL_TABLET | Freq: Once | SUBLINGUAL | Status: AC
  Administered 2024-02-03: 0.8 mg via SUBLINGUAL
  Filled 2024-02-03: qty 25

## 2024-02-03 MED ORDER — IOHEXOL 350 MG/ML SOLN
80.0000 mL | Freq: Once | INTRAVENOUS | Status: AC | PRN
  Administered 2024-02-03: 80 mL via INTRAVENOUS

## 2024-02-03 MED ORDER — NITROGLYCERIN 0.4 MG SL SUBL
SUBLINGUAL_TABLET | SUBLINGUAL | Status: AC
  Filled 2024-02-03: qty 2

## 2024-02-03 MED ORDER — DILTIAZEM HCL 25 MG/5ML IV SOLN
10.0000 mg | INTRAVENOUS | Status: DC | PRN
  Filled 2024-02-03: qty 5

## 2024-02-03 MED ORDER — METOPROLOL TARTRATE 5 MG/5ML IV SOLN
10.0000 mg | INTRAVENOUS | Status: DC | PRN
  Filled 2024-02-03: qty 10

## 2024-02-20 ENCOUNTER — Other Ambulatory Visit: Payer: Self-pay | Admitting: Internal Medicine

## 2024-02-20 DIAGNOSIS — Z87898 Personal history of other specified conditions: Secondary | ICD-10-CM

## 2024-02-20 DIAGNOSIS — R0602 Shortness of breath: Secondary | ICD-10-CM

## 2024-02-21 ENCOUNTER — Other Ambulatory Visit: Payer: Self-pay | Admitting: Family Medicine

## 2024-02-21 ENCOUNTER — Ambulatory Visit

## 2024-02-21 DIAGNOSIS — R17 Unspecified jaundice: Secondary | ICD-10-CM

## 2024-02-24 ENCOUNTER — Ambulatory Visit
Admission: RE | Admit: 2024-02-24 | Discharge: 2024-02-24 | Disposition: A | Discharge status: Home / Self Care | Source: Ambulatory Visit | Attending: Family Medicine | Admitting: Family Medicine

## 2024-02-24 DIAGNOSIS — R17 Unspecified jaundice: Secondary | ICD-10-CM | POA: Diagnosis present

## 2024-02-26 ENCOUNTER — Inpatient Hospital Stay: Attending: Internal Medicine | Admitting: Internal Medicine

## 2024-02-26 ENCOUNTER — Inpatient Hospital Stay

## 2024-02-26 ENCOUNTER — Encounter: Payer: Self-pay | Admitting: Internal Medicine

## 2024-02-26 VITALS — BP 127/61 | HR 65 | Temp 97.2°F | Resp 18 | Ht 69.0 in | Wt 143.3 lb

## 2024-02-26 DIAGNOSIS — D72829 Elevated white blood cell count, unspecified: Secondary | ICD-10-CM | POA: Insufficient documentation

## 2024-02-26 DIAGNOSIS — F03A0 Unspecified dementia, mild, without behavioral disturbance, psychotic disturbance, mood disturbance, and anxiety: Secondary | ICD-10-CM | POA: Insufficient documentation

## 2024-02-26 DIAGNOSIS — I10 Essential (primary) hypertension: Secondary | ICD-10-CM | POA: Diagnosis not present

## 2024-02-26 DIAGNOSIS — D72828 Other elevated white blood cell count: Secondary | ICD-10-CM | POA: Insufficient documentation

## 2024-02-26 NOTE — Progress Notes (Signed)
 [facility] OFFICE PROGRESS NOTE  Patient Care Team: Lauro Regulus, MD as PCP - General (Internal Medicine) Earna Coder, MD as Consulting Physician (Oncology) Earna Coder, MD as Consulting Physician (Hematology and Oncology)   # HEMATOLOGY HISTORY:  # LEUCOCYTOSIS- WBC-; N; L; Hb- platelets   Oncology History   No history exists.      INTERVAL HISTORY: Patient ambulating-independently. Accompanied by family- wife and daughter, Drema Halon.     Juan Barron 83 y.o.  male pleasant patient above history of with history of mild dementia is referred to [place] for further evaluation of elevated white count.  Of note patient had episodes of syncope in February 2025 which led to further workup with cardiology and also neurology.  Patient was empirically started on Keppra pending EEG.  Patient extensive cardiology workup including stress test, echo angiogram-and Holter monitor.   However, more recently patient felt poorly-and had multiple blood work-that showed intermittent elevated white count.  Patient has been referred to [place] for further consideration of any malignant process.  Review of Systems  Constitutional:  Positive for malaise/fatigue and weight loss. Negative for chills, diaphoresis and fever.  HENT:  Negative for nosebleeds and sore throat.   Eyes:  Negative for double vision.  Respiratory:  Negative for cough, hemoptysis, sputum production, shortness of breath and wheezing.   Cardiovascular:  Negative for chest pain, palpitations, orthopnea and leg swelling.  Gastrointestinal:  Negative for abdominal pain, blood in stool, constipation, diarrhea, heartburn, melena, nausea and vomiting.  Genitourinary:  Negative for dysuria, frequency and urgency.  Musculoskeletal:  Negative for back pain and joint pain.  Skin: Negative.  Negative for itching and rash.  Neurological:  Negative for dizziness, tingling, focal weakness, weakness and headaches.   Endo/Heme/Allergies:  Does not bruise/bleed easily.  Psychiatric/Behavioral:  Negative for depression. The patient is not nervous/anxious and does not have insomnia.       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     Hyperlipidemia, unspecified 05/16/2014   Last Assessment & Plan:  Diet for healthy cholesterol is being attempted and no clear myalgia's or other side effects are noted.     Incomplete emptying of bladder 05/27/2013   Melanoma (HCC)    Memory disturbance 07/22/2015   Overview:  Mild and seemingly not progressive  Last Assessment & Plan:  Memory is poor but not clearly progressive   Mild cognitive impairment with memory loss 08/28/2011   Overview:  Amnestic MCI.  MMSE 27/30.  MOCA on 08/25/13 was 20/30   Post-void dribbling 05/27/2013   Thyroid nodule 05/16/2014   Overview:  And subclinical hypothyroid  Last Assessment & Plan:  Tsh and energy are fairly stable.     PAST SURGICAL HISTORY :   Past Surgical History:  Procedure Laterality Date   COLONOSCOPY WITH PROPOFOL N/A 05/03/2020   Procedure: COLONOSCOPY WITH PROPOFOL;  Surgeon: Midge Minium, MD;  Location: [facility] ENDOSCOPY;  Service: Endoscopy;  Laterality: N/A;   HYDROCELE EXCISION     VASECTOMY  1977    FAMILY HISTORY :   Family History  Problem Relation Age of Onset  Kidney cancer Sister    Prostate cancer Neg Hx    Bladder Cancer Neg Hx     SOCIAL HISTORY:   Social History   Tobacco Use   Smoking status: Never   Smokeless tobacco: Never  Vaping Use   Vaping status: Never Used  Substance Use Topics   Alcohol use: No   Drug use: No    ALLERGIES:  has  no known allergies.  MEDICATIONS:  Current Outpatient Medications  Medication Sig Dispense Refill   aspirin EC 81 MG tablet Take by mouth.     cyanocobalamin 1000 MCG tablet Take by mouth.     Docusate Sodium (DSS) 100 MG CAPS Take by mouth.     levothyroxine (SYNTHROID) 50 MCG tablet Take by mouth.     Multiple Vitamins-Minerals (MULTIVITAMIN WITH MINERALS) tablet Take by mouth.     Omega 3-6-9 Fatty Acids (OMEGA 3-6-9 PO) Take by mouth.     levETIRAcetam (KEPPRA) 500 MG tablet Take 500 mg by mouth 2 (two) times daily.     tamsulosin (FLOMAX) 0.4 MG CAPS capsule TAKE 1 CAPSULE(0.4 MG) BY MOUTH DAILY AFTER BREAKFAST (Patient not taking: Reported on 02/26/2024) 90 capsule 3   No current facility-administered medications for this visit.    PHYSICAL EXAMINATION:  BP 127/61 (BP Location: Left Arm, Patient Position: Sitting)   Pulse 65   Temp (!) 97.2 F (36.2 C) (Tympanic)   Resp 18   Ht 5\' 9"  (1.753 m)   Wt 143 lb 4.8 oz (65 kg)   SpO2 99%   BMI 21.16 kg/m   Filed Weights   02/26/24 1406  Weight: 143 lb 4.8 oz (65 kg)    Physical Exam Vitals and nursing note reviewed.  HENT:     Head: Normocephalic and atraumatic.     Mouth/Throat:     Pharynx: Oropharynx is clear.  Eyes:     Extraocular Movements: Extraocular movements intact.     Pupils: Pupils are equal, round, and reactive to light.  Cardiovascular:     Rate and Rhythm: Normal rate and regular rhythm.  Pulmonary:     Comments: Decreased breath sounds bilaterally.  Abdominal:     Palpations: Abdomen is soft.  Musculoskeletal:        General: Normal range of motion.     Cervical back: Normal range of motion.  Skin:    General: Skin is warm.  Neurological:     General: No focal deficit present.     Mental Status: He is alert and oriented to person, place, and time.  Psychiatric:        Behavior: Behavior normal.        Judgment: Judgment normal.        LABORATORY DATA:  I have reviewed the data as  listed    Component Value Date/Time   NA 139 12/24/2023 1357   K 3.8 12/24/2023 1357   CL 107 12/24/2023 1357   CO2 25 12/24/2023 1357   GLUCOSE 146 (H) 12/24/2023 1357   BUN 25 (H) 12/24/2023 1357   CREATININE 1.13 12/24/2023 1357   CALCIUM 7.8 (L) 12/24/2023 1357   PROT 5.8 (L) 12/24/2023 1357   ALBUMIN 3.4 (L) 12/24/2023 1357   AST 14 (L) 12/24/2023 1357   ALT 14 12/24/2023 1357   ALKPHOS 68 12/24/2023 1357   BILITOT 1.1 12/24/2023 1357   GFRNONAA >60 12/24/2023 1357   GFRAA >60 01/11/2019 1520    No results found for: "SPEP", "UPEP"  Lab Results  Component  Value Date   WBC 15.1 (H) 12/24/2023   NEUTROABS 8.4 (H) 01/11/2019   HGB 13.4 12/24/2023   HCT 39.6 12/24/2023   MCV 92.3 12/24/2023   PLT 215 12/24/2023      Chemistry      Component Value Date/Time   NA 139 12/24/2023 1357   K 3.8 12/24/2023 1357   CL 107 12/24/2023 1357   CO2 25 12/24/2023 1357   BUN 25 (H) 12/24/2023 1357   CREATININE 1.13 12/24/2023 1357      Component Value Date/Time   CALCIUM 7.8 (L) 12/24/2023 1357   ALKPHOS 68 12/24/2023 1357   AST 14 (L) 12/24/2023 1357   ALT 14 12/24/2023 1357   BILITOT 1.1 12/24/2023 1357       RADIOGRAPHIC STUDIES: I have personally reviewed the radiological images as listed and agreed with the findings in the report. No results found.   ASSESSMENT & PLAN:  Neutrophilia # # LEUCOCYTOSIS/neutrophilia-intermittent neutrophilia/immature cells noted in the differential.-most recent neutrophil count 10,000 within normal limits.  Relatively stable/normal hemoglobin platelets.  April 2025 PCP- flow cytometry negative for any malignant appearing blood cells.  I had a Long discussion with the patient regarding multiple etiologies of elevated white count including but not limited to reactive causes like infection; inflammation.  However other etiologies include malignancy/leukemia etc. however given the fact that patient's most recent CBC from last week showed  normal white count, this is not suggestive of any malignant process.  I assume patient is elevated white count most recently was related to infection/inflammation which is current resolved.  # FEB 2025 Syncope-x 3 [5 years] ? Seizure- on keppra [Dr.Shah-Neuro]; cardiac- s/p evaluation [off all HTN meds]- OFF flomax.  Defer to cardiology/neurology.  Thank you Ms.Fields for allowing me to participate in the care of your pleasant patient. Please do not hesitate to contact me with questions or concerns in the interim.  I had a long discussion with the patient his wife and also daughter, Drema Halon.  They are in agreement with the plan.  # DISPOSITION:. # no labs # Follow-up as needed- Dr.B  [facility].Dr.Callwood; Manson Allan, Dr.Anderson, Dr.Shah-    No orders of the defined types were placed in this encounter.  All questions were answered. The patient knows to call the clinic with any problems, questions or concerns.      Earna Coder, MD 02/26/2024 4:55 PM

## 2024-02-26 NOTE — Progress Notes (Signed)
 Pt here for elevated  wbc's. Denies fevers, night sweats. Had 1 episode of abdominal pain. [place] ordered and was negative. Pt seems more fatigued, has lost weight. Pt is taking generic keppra wife wonders if low energy and low appetite can be attributed to symptoms. Pt was on 3 cardiac meds and diuretics which all has been put on hold.

## 2024-02-26 NOTE — Assessment & Plan Note (Addendum)
# #   LEUCOCYTOSIS/neutrophilia-intermittent neutrophilia/immature cells noted in the differential.-most recent neutrophil count 10,000 within normal limits.  Relatively stable/normal hemoglobin platelets.  April 2025 PCP- flow cytometry negative for any malignant appearing blood cells.  I had a Long discussion with the patient regarding multiple etiologies of elevated white count including but not limited to reactive causes like infection; inflammation.  However other etiologies include malignancy/leukemia etc. however given the fact that patient's most recent CBC from last week showed normal white count, this is not suggestive of any malignant process.  I assume patient is elevated white count most recently was related to infection/inflammation which is current resolved.  # FEB 2025 Syncope-x 3 [5 years] ? Seizure- on keppra [Dr.Shah-Neuro]; cardiac- s/p evaluation [off all HTN meds]- OFF flomax.  Defer to cardiology/neurology.  Thank you Ms.Fields for allowing me to participate in the care of your pleasant patient. Please do not hesitate to contact me with questions or concerns in the interim.  I had a long discussion with the patient his wife and also daughter, Drema Halon.  They are in agreement with the plan.  # DISPOSITION:. # no labs # Follow-up as needed- Dr.B  CCC.Dr.Callwood; Manson Allan, Dr.Anderson, Dr.Shah-
